# Patient Record
Sex: Male | Born: 1945 | Race: White | Hispanic: No | Marital: Married
Health system: Midwestern US, Academic
[De-identification: ages and names within clinical notes are randomized; demographics above are authoritative.]

---

## 2017-04-11 MED ORDER — TIZANIDINE 2 MG PO TAB
2 mg | ORAL_TABLET | Freq: Two times a day (BID) | ORAL | 0 refills | Status: AC | PRN
Start: 2017-04-11 — End: ?

## 2017-04-12 MED ORDER — BUPIVACAINE (PF) 0.5 % (5 MG/ML) IJ SOLN
10 mL | Freq: Once | INTRAMUSCULAR | 0 refills | Status: CP | PRN
Start: 2017-04-12 — End: ?

## 2017-04-12 MED ORDER — METHYLPREDNISOLONE ACETATE 40 MG/ML IJ SUSP
40 mg | Freq: Once | INTRA_ARTICULAR | 0 refills | Status: CP | PRN
Start: 2017-04-12 — End: ?

## 2017-05-09 ENCOUNTER — Ambulatory Visit: Admit: 2017-05-09 | Discharge: 2017-05-09 | Payer: MEDICARE

## 2017-05-09 ENCOUNTER — Encounter: Admit: 2017-05-09 | Discharge: 2017-05-09 | Payer: MEDICARE

## 2017-05-09 DIAGNOSIS — M961 Postlaminectomy syndrome, not elsewhere classified: Secondary | ICD-10-CM

## 2017-05-09 DIAGNOSIS — I10 Essential (primary) hypertension: Principal | ICD-10-CM

## 2017-05-09 DIAGNOSIS — G8929 Other chronic pain: ICD-10-CM

## 2017-05-09 DIAGNOSIS — M5416 Radiculopathy, lumbar region: ICD-10-CM

## 2017-05-16 ENCOUNTER — Encounter: Admit: 2017-05-16 | Discharge: 2017-05-16 | Payer: MEDICARE

## 2017-05-16 DIAGNOSIS — I10 Essential (primary) hypertension: Principal | ICD-10-CM

## 2017-05-25 ENCOUNTER — Encounter: Admit: 2017-05-25 | Discharge: 2017-05-25 | Payer: MEDICARE

## 2017-05-25 DIAGNOSIS — M5416 Radiculopathy, lumbar region: Principal | ICD-10-CM

## 2017-05-25 DIAGNOSIS — M961 Postlaminectomy syndrome, not elsewhere classified: ICD-10-CM

## 2017-05-25 DIAGNOSIS — G8929 Other chronic pain: ICD-10-CM

## 2017-05-30 ENCOUNTER — Ambulatory Visit: Admit: 2017-05-30 | Discharge: 2017-05-31 | Payer: MEDICARE

## 2017-05-30 ENCOUNTER — Encounter: Admit: 2017-05-30 | Discharge: 2017-05-30 | Payer: MEDICARE

## 2017-05-30 DIAGNOSIS — I10 Essential (primary) hypertension: Principal | ICD-10-CM

## 2017-05-30 NOTE — Progress Notes
[facility] CLINIC NOTE  Subjective     SUBJECTIVE:   Follow up for lower back pain  Chronic lower back and hip pain  Pain is sharp, stabbing, and severe  VAS 7/10  Has had intrathecal trials x 3: all with good pain relief, inquiring about ITP implant         Review of Systems   Constitutional: Positive for fatigue.   HENT: Positive for postnasal drip, rhinorrhea and tinnitus.    Eyes: Positive for discharge, redness and itching.   Respiratory: Positive for apnea and shortness of breath.    Gastrointestinal: Positive for abdominal pain.   Endocrine: Positive for heat intolerance.   Genitourinary: Positive for frequency.   Musculoskeletal: Positive for arthralgias, back pain, gait problem, myalgias, neck pain and neck stiffness.   Neurological: Positive for light-headedness and headaches.   Hematological: Bruises/bleeds easily.   Psychiatric/Behavioral: Positive for sleep disturbance.   All other systems reviewed and are negative.      Current Outpatient Prescriptions:   ???  amLODIPine (NORVASC) 10 mg tablet, 10 mg., Disp: , Rfl:   ???  BUPROPION HCL (WELLBUTRIN PO), Take 150 mg by mouth., Disp: , Rfl:   ???  cloNIDine (CATAPRES) 0.1 mg tablet, Take 1 tablet by mouth twice daily., Disp: 7 tablet, Rfl: 3  ???  diphenhydrAMINE (BENADRYL) 25 mg capsule, Take  by mouth., Disp: , Rfl:   ???  duloxetine DR (CYMBALTA) 30 mg capsule, , Disp: , Rfl:   ???  FOLIC ACID/MULTIVIT-MIN/LUTEIN (CENTRUM SILVER PO), Take  by mouth., Disp: , Rfl:   ???  lisinopril (PRINIVIL, ZESTRIL) 40 mg tablet, , Disp: , Rfl:   ???  metoprolol tartrate (LOPRESSOR) 100 mg tablet, , Disp: , Rfl:   ???  oxyCODONE/acetaminophen (PERCOCET; ENDOCET) 10/325 mg tablet, , Disp: , Rfl:   ???  SENNOSIDES (SENNA LAX PO), Take 8.6 mg by mouth., Disp: , Rfl:   No Known Allergies  Physical Exam  Vitals:    05/30/17 1044   BP: (!) 142/104   Pulse: 53   SpO2: 100%   Weight: 87.1 kg (192 lb)   Height: 177.8 cm (70)        Pain Score: Eight  Body mass index is 27.55 kg/m???. Gen: Alert x 3  Chest: CTAB  Neck:Supple  Psych: Normal mood and affect  Skin: no rashes or lesions  Neuro: Grossly intact  Musc:     Tenderness in L-spine   strnegth intact in BLE's         IMPRESSION:  1. Chronic intractable pain     2. Lumbar radiculopathy     3. Cervical radiculopathy           PLAN:    Discussed proceeding with intrathecal pain pump   Will trial Prialt given his side effects with other medications trialed

## 2017-05-31 DIAGNOSIS — M5416 Radiculopathy, lumbar region: ICD-10-CM

## 2017-05-31 DIAGNOSIS — M5412 Radiculopathy, cervical region: Secondary | ICD-10-CM

## 2017-05-31 DIAGNOSIS — G8929 Other chronic pain: Principal | ICD-10-CM

## 2017-06-21 ENCOUNTER — Encounter: Admit: 2017-06-21 | Discharge: 2017-06-21 | Payer: MEDICARE

## 2017-06-21 NOTE — Telephone Encounter
Patient has questions about his pump trial on 07/08/17. He has so much pain from the SCS pocket that he wants it moved.  RN answered questions and patient will discuss with Dr. Otho Ket at the trial.

## 2017-07-08 ENCOUNTER — Ambulatory Visit: Admit: 2017-07-08 | Discharge: 2017-07-09 | Payer: MEDICARE

## 2017-07-08 ENCOUNTER — Encounter: Admit: 2017-07-08 | Discharge: 2017-07-08 | Payer: MEDICARE

## 2017-07-08 DIAGNOSIS — G893 Neoplasm related pain (acute) (chronic): Principal | ICD-10-CM

## 2017-07-08 DIAGNOSIS — I10 Essential (primary) hypertension: Principal | ICD-10-CM

## 2017-07-08 MED ORDER — ZICONOTIDE IT TRIAL SYRINGE (LOW DOSE)
2 ug | Freq: Once | INTRATHECAL | 0 refills | Status: CP
Start: 2017-07-08 — End: ?
  Administered 2017-07-08 (×2): 2 ug via INTRATHECAL

## 2017-07-08 MED ORDER — IOPAMIDOL 41 % IT SOLN
2.5 mL | Freq: Once | EPIDURAL | 0 refills | Status: CP
Start: 2017-07-08 — End: ?
  Administered 2017-07-08: 15:00:00 2.5 mL via EPIDURAL

## 2017-07-08 NOTE — Procedures
Attending Surgeon: [name], MD    Anesthesia: Local    Pre-Procedure Diagnosis:   1. Chronic intractable pain    2. Lumbar radiculopathy        Post-Procedure Diagnosis:   1. Chronic intractable pain    2. Lumbar radiculopathy        Intrathecal Pump Trial  Procedure: epidural - interlaminar    Laterality: n/a  Location: lumbar - L5-S1      Consent:   Consent obtained: verbal and written  Consent given by: patient  Risks discussed: allergic reaction, bleeding, bruising, infection, nerve damage, no change or worsening in pain, weakness, swelling and reaction to medication  Alternatives discussed: alternative treatment and delayed treatment  Discussed with patient the purpose of the treatment/procedure, other ways of treating my condition, including no treatment/ procedure and the risks and benefits of the alternatives. Patient has decided to proceed with treatment/procedure.        Universal Protocol:  Relevant documents: relevant documents present and verified  Test results: test results available and properly labeled  Imaging studies: imaging studies available  Required items: required blood products, implants, devices, and special equipment available  Site marked: the operative site was marked  Patient identity confirmed: Patient identify confirmed verbally with patient.      Time out: Immediately prior to procedure a time out was called to verify the correct patient, procedure, equipment, support staff and site/side marked as required      Procedures Details:   Indications: pain   Prep: Betadine  Patient position: prone  Estimated Blood Loss: minimal  Specimens: none  Amount Injected:   L5-S1: 1mL    Number of Levels: 1  Approach: midline  Guidance: fluoroscopy  Contrast: Procedure confirmed with contrast under live fluoroscopy.  Needle and Epidural Catheter: pencil-tip  Needle size: 25 G  Injection procedure: Incremental injection and Negative aspiration for blood Patient tolerance: Patient tolerated the procedure well with no immediate complications. Pressure was applied, and hemostasis was accomplished.  Outcome: Pain unchanged  Comments: of ziconotide injected. Pain relief of 80% without any side effects noted      Estimated blood loss: none or minimal  Specimens: none  Patient tolerated the procedure well with no immediate complications. Pressure was applied, and hemostasis was accomplished.

## 2017-07-08 NOTE — Progress Notes
1050: patient tolerated walking around the unit twice following administration of his medication. He reports that his pain is a 4, but that he feels fine. Dr. Otho Ket and Cheri by to speak with the patient-sch'd for surgery on the 27th. AVS reviewed at the bedside. No questions or concerns prior to releasing patient.

## 2017-07-08 NOTE — Progress Notes
[facility]  INTERVENTIONAL PAIN PROCEDURE HISTORY AND PHYSICAL    Chief Complaint   Patient presents with    Lower Back - Pain       HISTORY OF PRESENT ILLNESS:   Follow up for lower back pain  Chronic lower back and hip pain  Pain is sharp, stabbing, and severe  VAS 7/10  Has had intrathecal trials x 3: all with good pain relief, inquiring about ITP implant    Past Medical History:   Diagnosis Date    Essential hypertension        Past Surgical History:   Procedure Laterality Date    BACK SURGERY      [place]         family history is not on file.    Social History     Social History    Marital status: Married     Spouse name: N/A    Number of children: N/A    Years of education: N/A     Occupational History    Not on file.     Social History Main Topics    Smoking status: Never Smoker    Smokeless tobacco: Never Used    Alcohol use Not on file    Drug use: Unknown    Sexual activity: Not on file     Other Topics Concern    Not on file     Social History Narrative    No narrative on file       No Known Allergies    Vitals:    07/08/17 0855   BP: 149/74   Pulse: 50   Resp: 18   Temp: 36.6 C (97.8 F)   TempSrc: Oral   SpO2: 99%   Weight: 88.5 kg (195 lb)   Height: 177.8 cm (70")       REVIEW OF SYSTEMS: 10 point ROS obtained and negative except per HPI      PHYSICAL EXAM:  Gen: Alert x 3  Chest: CTAB  Neck:Supple  Psych: Normal mood and affect  Skin: no rashes or lesions  Neuro: Grossly intact  Musc:     Tenderness in L-spine        IMPRESSION:    1. Chronic intractable pain    2. Lumbar radiculopathy         PLAN:   Intrathecal trial with prialt

## 2017-07-09 ENCOUNTER — Ambulatory Visit: Admit: 2017-07-08 | Discharge: 2017-07-09 | Payer: MEDICARE

## 2017-07-09 DIAGNOSIS — I10 Essential (primary) hypertension: ICD-10-CM

## 2017-07-09 DIAGNOSIS — M5416 Radiculopathy, lumbar region: ICD-10-CM

## 2017-07-09 DIAGNOSIS — G8929 Other chronic pain: Principal | ICD-10-CM

## 2017-07-11 ENCOUNTER — Encounter: Admit: 2017-07-11 | Discharge: 2017-07-11 | Payer: MEDICARE

## 2017-07-11 DIAGNOSIS — M5416 Radiculopathy, lumbar region: Principal | ICD-10-CM

## 2017-07-22 ENCOUNTER — Encounter: Admit: 2017-07-22 | Discharge: 2017-07-22 | Payer: MEDICARE

## 2017-07-22 NOTE — Telephone Encounter
RN called patient to confirm his surgery on Monday, August 27th. NPO for 8 hours, Hibiclens soap at bedtime, no blood thinners. Patient understands and agrees to plan.

## 2017-07-24 ENCOUNTER — Encounter: Admit: 2017-07-24 | Discharge: 2017-07-24 | Payer: MEDICARE

## 2017-07-24 DIAGNOSIS — I10 Essential (primary) hypertension: Principal | ICD-10-CM

## 2017-07-25 ENCOUNTER — Encounter: Admit: 2017-07-25 | Discharge: 2017-07-25 | Payer: MEDICARE

## 2017-07-25 ENCOUNTER — Ambulatory Visit: Admit: 2017-07-23 | Discharge: 2017-07-23 | Payer: MEDICARE

## 2017-07-25 ENCOUNTER — Ambulatory Visit: Admit: 2017-07-25 | Discharge: 2017-07-25 | Payer: MEDICARE

## 2017-07-25 DIAGNOSIS — M47812 Spondylosis without myelopathy or radiculopathy, cervical region: ICD-10-CM

## 2017-07-25 DIAGNOSIS — T85193A Other mechanical complication of implanted electronic neurostimulator, generator, initial encounter: Principal | ICD-10-CM

## 2017-07-25 DIAGNOSIS — I10 Essential (primary) hypertension: ICD-10-CM

## 2017-07-25 MED ORDER — LACTATED RINGERS IV SOLP
INTRAVENOUS | 0 refills | Status: DC
Start: 2017-07-25 — End: 2017-07-26
  Administered 2017-07-25 (×2): 1000.000 mL via INTRAVENOUS

## 2017-07-25 MED ORDER — PROPOFOL INJ 10 MG/ML IV VIAL
0 refills | Status: DC
Start: 2017-07-25 — End: 2017-07-25
  Administered 2017-07-25: 20:00:00 40 mg via INTRAVENOUS
  Administered 2017-07-25: 19:00:00 50 mg via INTRAVENOUS
  Administered 2017-07-25: 20:00:00 20 mg via INTRAVENOUS
  Administered 2017-07-25: 20:00:00 30 mg via INTRAVENOUS

## 2017-07-25 MED ORDER — PROMETHAZINE 25 MG/ML IJ SOLN
6.25 mg | INTRAVENOUS | 0 refills | Status: DC | PRN
Start: 2017-07-25 — End: 2017-07-26

## 2017-07-25 MED ORDER — HYDROMORPHONE (PF) 2 MG/ML IJ SYRG
.5-1 mg | INTRAVENOUS | 0 refills | Status: DC | PRN
Start: 2017-07-25 — End: 2017-07-26
  Administered 2017-07-25 (×3): 0.5 mg via INTRAVENOUS
  Administered 2017-07-25: 21:00:00 1 mg via INTRAVENOUS

## 2017-07-25 MED ORDER — LIDOCAINE (PF) 10 MG/ML (1 %) IJ SOLN
.1-2 mL | INTRAMUSCULAR | 0 refills | Status: DC | PRN
Start: 2017-07-25 — End: 2017-07-26

## 2017-07-25 MED ORDER — CEPHALEXIN 500 MG PO CAP
500 mg | ORAL_CAPSULE | Freq: Four times a day (QID) | ORAL | 0 refills | Status: AC
Start: 2017-07-25 — End: ?

## 2017-07-25 MED ORDER — OXYCODONE 5 MG PO TAB
5-10 mg | Freq: Once | ORAL | 0 refills | Status: CP | PRN
Start: 2017-07-25 — End: ?
  Administered 2017-07-25: 21:00:00 10 mg via ORAL

## 2017-07-25 MED ORDER — LIDOCAINE-EPINEPHRINE 1 %-1:100,000 IJ SOLN
0 refills | Status: DC
Start: 2017-07-25 — End: 2017-07-26
  Administered 2017-07-25: 20:00:00 18 mL via INTRAMUSCULAR

## 2017-07-25 MED ORDER — DIPHENHYDRAMINE HCL 50 MG/ML IJ SOLN
25 mg | Freq: Once | INTRAVENOUS | 0 refills | Status: DC | PRN
Start: 2017-07-25 — End: 2017-07-26

## 2017-07-25 MED ORDER — FENTANYL CITRATE (PF) 50 MCG/ML IJ SOLN
0 refills | Status: DC
Start: 2017-07-25 — End: 2017-07-25
  Administered 2017-07-25 (×2): 25 ug via INTRAVENOUS

## 2017-07-25 MED ORDER — MIDAZOLAM 1 MG/ML IJ SOLN
INTRAVENOUS | 0 refills | Status: DC
Start: 2017-07-25 — End: 2017-07-25
  Administered 2017-07-25 (×2): 1 mg via INTRAVENOUS

## 2017-07-25 MED ORDER — HYDROCODONE-ACETAMINOPHEN 5-325 MG PO TAB
1-2 | ORAL_TABLET | ORAL | 0 refills | 15.00000 days | Status: AC | PRN
Start: 2017-07-25 — End: 2017-08-15

## 2017-07-25 MED ORDER — DEXMEDETOMIDINE# 4MCG/ML IV SOLN
0 refills | Status: DC
Start: 2017-07-25 — End: 2017-07-25
  Administered 2017-07-25 (×5): 8 ug via INTRAVENOUS

## 2017-07-25 MED ORDER — PROPOFOL 10 MG/ML IV EMUL 50 ML (INFUSION)(AM)(OR)
INTRAVENOUS | 0 refills | Status: DC
Start: 2017-07-25 — End: 2017-07-25
  Administered 2017-07-25: 19:00:00 100 ug/kg/min via INTRAVENOUS

## 2017-07-25 MED ORDER — FENTANYL CITRATE (PF) 50 MCG/ML IJ SOLN
25-50 ug | INTRAVENOUS | 0 refills | Status: DC | PRN
Start: 2017-07-25 — End: 2017-07-26

## 2017-07-25 MED ORDER — HALOPERIDOL LACTATE 5 MG/ML IJ SOLN
1 mg | Freq: Once | INTRAVENOUS | 0 refills | Status: CP | PRN
Start: 2017-07-25 — End: ?
  Administered 2017-07-25: 21:00:00 1 mg via INTRAVENOUS

## 2017-07-25 MED ORDER — LACTATED RINGERS IV SOLP
INTRAVENOUS | 0 refills | Status: DC
Start: 2017-07-25 — End: 2017-07-26

## 2017-07-25 MED ORDER — CEFAZOLIN 1 GRAM IJ SOLR
0 refills | Status: DC
Start: 2017-07-25 — End: 2017-07-25
  Administered 2017-07-25: 20:00:00 2 g via INTRAVENOUS

## 2017-07-25 NOTE — Anesthesia Post-Procedure Evaluation
Post-Anesthesia Evaluation    Name: Kristopher Richard      MRN: [id number]     DOB: 1946-07-01     Age: 71 y.o.     Sex: male   __________________________________________________________________________     Procedure Date: 07/25/2017  Procedure: Procedure(s) with comments:  REVISION  SPINAL NEUROSTIMULATOR PULSE GENERATOR/ RECEIVER - CASE LENGTH 1 HOUR, REQUEST 1300 START IN 2ND ROOM, NEED C-ARM, NEVRO (REP AWARE)      Surgeon: Surgeon(s):  Clotilde Dieter, MD  Marilynn Rail., DO    Post-Anesthesia Vitals  BP: 112/55 (08/27 1645)  Pulse: 54 (08/27 1645)  Respirations: 12 PER MINUTE (08/27 1645)  SpO2: 97 % (08/27 1645)  O2 Delivery: None (Room Air) (08/27 1645)  SpO2 Pulse: 51 (08/27 1645)      Post Anesthesia Evaluation Note    Evaluation location: Pre/Post  Patient participation: recovered; patient participated in evaluation  Level of consciousness: alert    Pain score: 6 (Chronic Pain and pt states pain is tolerable)  Pain management: adequate    Hydration: normovolemia  Temperature: 36.0C - 38.4C  Airway patency: adequate    Perioperative Events  Perioperative events:  no       Post-op nausea and vomiting: no PONV    Postoperative Status  Cardiovascular status: hemodynamically stable  Respiratory status: spontaneous ventilation  Follow-up needed: none        Perioperative Events  Perioperative Event: No  Emergency Case Activation: No

## 2017-07-25 NOTE — Other
Procedure Note    Kristopher Richard is a 71 y.o. male.      Procedures  Immediate Post Procedure Note    Date:  07/25/2017    Kristopher Richard is a 71 y.o. y.o. male.     DOB: Mar 07, 1946                  MRN#:  [id number]                                   Attending Physician:   No name on file.  Performing Provider:  Jacqulynn Cadet. Royce Macadamia, DO    Procedure(s): Spinal Cord Stimulator Revision    Consent:  Consent obtained from patient.  Time out performed: Consent obtained, correct patient verified, correct procedure verified, correct site verified, patient marked as necessary.  Indications: IPG pocket pain    Anesthesia: MAC (Monitored Anesthesia Care)    Findings:  Intact IPG, pocket revision to left flank    Estimated Blood Loss:  None/Negligible  Specimen(s) Removed/Disposition:  None  Complications: None  Patient Tolerated Procedure: Well  Post-Procedure Condition:  stable                  Jacqulynn Cadet. Royce Macadamia, DO

## 2017-07-25 NOTE — Anesthesia Pre-Procedure Evaluation
Anesthesia Pre-Procedure Evaluation    Name: Kristopher Richard      MRN: [id number]     DOB: 21-Oct-1946     Age: 71 y.o.     Sex: male   __________________________________________________________________________     Procedure Date: 07/25/2017   Procedure: Procedure(s) with comments:  REMOVAL/ REVISION  SPINAL NEUROSTIMULATOR PULSE GENERATOR/ RECEIVER - CASE LENGTH 1 HOUR, REQUEST 1300 START IN 2ND ROOM, NEED C-ARM, NEVRO (REP AWARE)     Physical Assessment  Vital Signs (last filed in past 24 hours):  BP: 161/73 (08/27 1258)  Temp: 36.7 ???C (98.1 ???F) (08/27 1258)  Pulse: 52 (08/27 1258)  Respirations: 16 PER MINUTE (08/27 1258)  SpO2: 98 % (08/27 1258)  O2 Delivery: None (Room Air) (08/27 1258)  Height: 177.8 cm (70) (08/27 1258)  Weight: 90.7 kg (199 lb 15.3 oz) (08/27 1258)      Patient History  No Known Allergies     Current Medications    Medication Directions   amLODIPine (NORVASC) 10 mg tablet 10 mg.   BUPROPION HCL (WELLBUTRIN PO) Take 150 mg by mouth.   cloNIDine (CATAPRES) 0.1 mg tablet Take 1 tablet by mouth twice daily.   diphenhydrAMINE (BENADRYL) 25 mg capsule Take  by mouth.   duloxetine DR (CYMBALTA) 30 mg capsule    FOLIC ACID/MULTIVIT-MIN/LUTEIN (CENTRUM SILVER PO) Take  by mouth.   lisinopril (PRINIVIL, ZESTRIL) 40 mg tablet    metoprolol tartrate (LOPRESSOR) 100 mg tablet    oxyCODONE/acetaminophen (PERCOCET; ENDOCET) 10/325 mg tablet    SENNOSIDES (SENNA LAX PO) Take 8.6 mg by mouth.         Review of Systems/Medical History            Airway - negative        Pulmonary - negative          Cardiovascular         Exercise tolerance: >4 METS (limited by pain. no CP or SOA)        Hypertension, well controlled      No past MI,       No hx of coronary artery disease      No dysrhythmias      No angina      GI/Hepatic/Renal - negative        Neuro/Psych         Neuromuscular disease      Neuropathy      Lumbar radiculopathy s/p intrathecal pump placement        Musculoskeletal         Back pain      Arthritis Physical Exam    Airway Findings      Mallampati: II      TM distance: >3 FB      Neck ROM: full      Mouth opening: good      Airway patency: adequate      Comments: Limited neck flexion 2/2 fusion. Good ROM on extension      Dental Findings: Negative      Cardiovascular Findings:       Rhythm: regular      Rate: normal    Pulmonary Findings: Negative      Abdominal Findings: Negative         Diagnostic Tests  Hematology: No results found for: HGB, HCT, PLTCT, WBC, NEUT, ANC, LYMPH, ALC, ABSLYMPHCT, MONA, AMC, EOSA, ABC, BASOPHILS, MCV, MCH, MCHC, MPV, RDW      General Chemistry: No results found  for: NA, K, CL, CO2, GAP, BUN, CR, GLU, CA, KETONES, ALBUMIN, LACTIC, OBSCA, MG, TOTBILI, TOTBILCB, PO4   Coagulation: No results found for: PT, PTT, INR      Anesthesia Plan    ASA score: 2   Plan: MAC  Induction method: intravenous  NPO status: acceptable      Informed Consent  Anesthetic plan and risks discussed with patient.        Plan discussed with: anesthesiologist and CRNA.

## 2017-07-25 NOTE — H&P (View-Only)
Admission History and Physical Examination      Name:  Kristopher Richard                                             MRN:  [id number]   Admission Date:  07/25/2017                     Assessment/Plan:    Principal Problem:    Lumbar radiculopathy    Revision of SCS battery  __________________________________________________________________________________  Primary Care Physician: Kristopher Richard  Verified    Chief Complaint:  Battery pain  History of Present Illness: Kristopher Richard is a 71 y.o. male with severe battery site pain. Has failed conservative measures    Past Medical History:   Diagnosis Date   ??? Essential hypertension      Past Surgical History:   Procedure Laterality Date   ??? BACK SURGERY     ??? HERNIA REPAIR     ??? KNEE SURGERY     ??? NECK SURGERY     ??? PENIS SURGERY     ??? PROSTATECTOMY       Family history reviewed; non-contributory  Social History     Social History   ??? Marital status: Married     Spouse name: N/A   ??? Number of children: N/A   ??? Years of education: N/A     Social History Main Topics   ??? Smoking status: Never Smoker   ??? Smokeless tobacco: Never Used   ??? Alcohol use Not on file   ??? Drug use: Unknown   ??? Sexual activity: Not on file     Other Topics Concern   ??? Not on file     Social History Narrative   ??? No narrative on file      Immunizations (includes history and patient reported):   There is no immunization history on file for this patient.        Allergies:  Patient has no known allergies.    Medications:  Prescriptions Prior to Admission   Medication Sig   ??? amLODIPine (NORVASC) 10 mg tablet 10 mg.   ??? BUPROPION HCL (WELLBUTRIN PO) Take 150 mg by mouth.   ??? cloNIDine (CATAPRES) 0.1 mg tablet Take 1 tablet by mouth twice daily.   ??? diphenhydrAMINE (BENADRYL) 25 mg capsule Take  by mouth.   ??? duloxetine DR (CYMBALTA) 30 mg capsule    ??? FOLIC ACID/MULTIVIT-MIN/LUTEIN (CENTRUM SILVER PO) Take  by mouth.   ??? lisinopril (PRINIVIL, ZESTRIL) 40 mg tablet ??? metoprolol tartrate (LOPRESSOR) 100 mg tablet    ??? oxyCODONE/acetaminophen (PERCOCET; ENDOCET) 10/325 mg tablet    ??? SENNOSIDES (SENNA LAX PO) Take 8.6 mg by mouth.     Review of Systems:  All other systems reviewed and are negative.    Physical Exam:  Vital Signs: Last Filed In 24 Hours Vital Signs: 24 Hour Range   BP: 161/73 (08/27 1258)  Temp: 36.7 ???C (98.1 ???F) (08/27 1258)  Pulse: 52 (08/27 1258)  Respirations: 16 PER MINUTE (08/27 1258)  SpO2: 98 % (08/27 1258)  O2 Delivery: None (Room Air) (08/27 1258)  Height: 177.8 cm (70) (08/27 1258) BP: (161)/(73)   Temp:  [36.7 ???C (98.1 ???F)]   Pulse:  [52]   Respirations:  [16 PER MINUTE]   SpO2:  [98 %]  O2 Delivery: None (Room Air)   Intensity Pain Scale 0-10 (Pain 1): 8 (07/25/17 1308)      General:  Alert, cooperative, no distress, appears stated age  Ears:  Normal TMs and external ear canals, both ears  Throat:  Lips, mucosa and tongue normal.  Teeth and gums normal  Lungs:  Clear to auscultation bilaterally  Chest wall:  No tenderness or deformity.  Abdomen:  Soft, non-tender.  Bowel sounds normal.  No masses.  No organomegaly.    Lab/Radiology/Other Diagnostic Tests:  Pertinent labs reviewed     Pertinent radiology reviewed.    Kristopher Kennedy, MD  Pager [phone]

## 2017-07-27 ENCOUNTER — Encounter: Admit: 2017-07-27 | Discharge: 2017-07-27 | Payer: MEDICARE

## 2017-07-27 DIAGNOSIS — I10 Essential (primary) hypertension: Principal | ICD-10-CM

## 2017-08-04 ENCOUNTER — Encounter: Admit: 2017-08-04 | Discharge: 2017-08-04 | Payer: MEDICARE

## 2017-08-04 ENCOUNTER — Ambulatory Visit: Admit: 2017-08-04 | Discharge: 2017-08-05 | Payer: MEDICARE

## 2017-08-04 DIAGNOSIS — I10 Essential (primary) hypertension: Principal | ICD-10-CM

## 2017-08-04 DIAGNOSIS — M961 Postlaminectomy syndrome, not elsewhere classified: ICD-10-CM

## 2017-08-04 NOTE — Progress Notes
[facility] CLINIC NOTE  Subjective     SUBJECTIVE: Kristopher Richard presents for follow-up of spinal cord stimulator generator revision.  He says the new position of the generator is much more tolerable.  He does have some soreness in the area.  He denies fevers, chills or excessive drainage from either incision.  He has been adherent to the post procedure restrictions including lifting and bathing.  He inquired about an intrathecal pump placement.  He reports multiple intrathecal trials with good results and minimal side effects from Prialt.  He is interestingly in proceeding with placement of an intrathecal pump.         Review of Systems   Constitutional: Positive for diaphoresis and fatigue.   HENT: Positive for rhinorrhea and tinnitus.    Eyes: Positive for redness and itching.   Respiratory: Positive for apnea.    Gastrointestinal: Positive for abdominal pain and constipation.   Genitourinary: Positive for difficulty urinating and frequency.   Musculoskeletal: Positive for arthralgias, back pain, joint swelling, myalgias, neck pain and neck stiffness.   Neurological: Positive for light-headedness and headaches.   Hematological: Bruises/bleeds easily.   Psychiatric/Behavioral: Positive for confusion, decreased concentration and sleep disturbance.   All other systems reviewed and are negative.       Current Outpatient Prescriptions:   ???  amLODIPine (NORVASC) 10 mg tablet, 10 mg., Disp: , Rfl:   ???  BUPROPION HCL (WELLBUTRIN PO), Take 150 mg by mouth., Disp: , Rfl:   ???  cloNIDine (CATAPRES) 0.1 mg tablet, Take 1 tablet by mouth twice daily., Disp: 7 tablet, Rfl: 3  ???  diphenhydrAMINE (BENADRYL) 25 mg capsule, Take  by mouth., Disp: , Rfl:   ???  duloxetine DR (CYMBALTA) 30 mg capsule, , Disp: , Rfl:   ???  FOLIC ACID/MULTIVIT-MIN/LUTEIN (CENTRUM SILVER PO), Take  by mouth., Disp: , Rfl:   ???  HYDROcodone/acetaminophen (NORCO) 5/325 mg tablet, Take one tablet to two tablets by mouth every 4 hours as needed for Pain for up to 15 doses, Disp: 15 tablet, Rfl: 0  ???  lisinopril (PRINIVIL, ZESTRIL) 40 mg tablet, , Disp: , Rfl:   ???  metoprolol tartrate (LOPRESSOR) 100 mg tablet, , Disp: , Rfl:   ???  oxyCODONE/acetaminophen (PERCOCET; ENDOCET) 10/325 mg tablet, , Disp: , Rfl:   ???  SENNOSIDES (SENNA LAX PO), Take 8.6 mg by mouth., Disp: , Rfl:   No Known Allergies  Physical Exam  Vitals:    08/04/17 1017   BP: 148/63   Pulse: 51   SpO2: 99%   Weight: 90.7 kg (200 lb)   Height: 177.8 cm (70)        Pain Score: Eight  Body mass index is 28.7 kg/m???.    General: Alert, cooperative, no distress  Head: Normocephalic, atraumatic  Eyes: Conjunctivae/corneas clear  Lungs: Unlabored respirations  Heart: Normal rate by palpation of pulse  Abdomen: Non-distended  Skin: Warm and dry to touch  Psychiatric: Mood and affect normal  Musculoskeletal: Left lower lumbar incision c/d/i without drainage, erythema or ecchymosis; left upper lumbar incision c/d/i with scab overlying upper part of incision, mild ecchymoses over generator, no erythema, drainage or excessive tenderness  Neurologic: Grossly intact         IMPRESSION:  1. Lumbar radiculopathy     2. Chronic intractable pain     3. Cervical radiculopathy     4. Post laminectomy syndrome           PLAN:  We will get him scheduled for intrathecal pump placement.  Absent S/S of infection.  Staples removed. Incisions cleansed with chloro prep and allowed to dry before applying dressing.   Keep incision dry with gauze and tegaderm for a week.   May shower and allow steristrip to get wet thereafter. Pat dry. Do not submerge in water.   Allow steristrips to fall off on their own.   May apply ice pack to surgical site for comfort.   Wear back brace for two weeks post-op.   Gradually increase activity while following activity restrictions.  8 week post-operative activity restrictions; avoiding agressive bending/twisting, reaching overhead, lifting/pulling >10lb.   Activity restrictions will be complete [09/19/17]  RTC in 3 weeks for follow up.       ATTESTATION    I personally performed the key portions of the E/M visit, discussed case with resident and concur with resident documentation of history, physical exam, assessment, and treatment plan unless otherwise noted.    Staff name:  [name], MD Date:  08/04/2017

## 2017-08-05 ENCOUNTER — Encounter: Admit: 2017-08-05 | Discharge: 2017-08-05 | Payer: MEDICARE

## 2017-08-05 DIAGNOSIS — M5416 Radiculopathy, lumbar region: Principal | ICD-10-CM

## 2017-08-05 DIAGNOSIS — M961 Postlaminectomy syndrome, not elsewhere classified: ICD-10-CM

## 2017-08-05 DIAGNOSIS — M5412 Radiculopathy, cervical region: ICD-10-CM

## 2017-08-05 DIAGNOSIS — M545 Low back pain: ICD-10-CM

## 2017-08-15 ENCOUNTER — Encounter: Admit: 2017-08-15 | Discharge: 2017-08-15 | Payer: MEDICARE

## 2017-08-15 ENCOUNTER — Ambulatory Visit: Admit: 2017-08-15 | Discharge: 2017-08-16 | Payer: MEDICARE

## 2017-08-15 DIAGNOSIS — I10 Essential (primary) hypertension: Principal | ICD-10-CM

## 2017-08-15 DIAGNOSIS — Z9689 Presence of other specified functional implants: ICD-10-CM

## 2017-08-15 MED ORDER — CEPHALEXIN 500 MG PO CAP
500 mg | ORAL_CAPSULE | Freq: Four times a day (QID) | ORAL | 0 refills | Status: AC
Start: 2017-08-15 — End: ?

## 2017-08-15 MED ORDER — MUPIROCIN CALCIUM 2 % TP CREA
Freq: Three times a day (TID) | TOPICAL | 0 refills | 11.00000 days | Status: AC
Start: 2017-08-15 — End: ?

## 2017-08-16 DIAGNOSIS — Z9889 Other specified postprocedural states: Principal | ICD-10-CM

## 2017-08-16 DIAGNOSIS — M545 Low back pain: ICD-10-CM

## 2017-08-16 DIAGNOSIS — T814XXA Infection following a procedure, initial encounter: ICD-10-CM

## 2017-08-16 DIAGNOSIS — G8929 Other chronic pain: ICD-10-CM

## 2017-08-16 LAB — CBC AND DIFF

## 2017-08-16 LAB — C REACTIVE PROTEIN (CRP)

## 2017-08-16 LAB — SED RATE

## 2017-08-17 ENCOUNTER — Encounter: Admit: 2017-08-17 | Discharge: 2017-08-17 | Payer: MEDICARE

## 2017-08-17 DIAGNOSIS — T814XXA Infection following a procedure, initial encounter: ICD-10-CM

## 2017-08-17 DIAGNOSIS — M545 Low back pain: ICD-10-CM

## 2017-08-17 DIAGNOSIS — Z9689 Presence of other specified functional implants: Principal | ICD-10-CM

## 2017-08-24 ENCOUNTER — Encounter: Admit: 2017-08-24 | Discharge: 2017-08-24 | Payer: MEDICARE

## 2017-08-24 ENCOUNTER — Ambulatory Visit: Admit: 2017-08-24 | Discharge: 2017-08-25 | Payer: MEDICARE

## 2017-08-24 DIAGNOSIS — I10 Essential (primary) hypertension: Principal | ICD-10-CM

## 2017-08-24 DIAGNOSIS — M545 Low back pain: ICD-10-CM

## 2017-08-25 DIAGNOSIS — Z5189 Encounter for other specified aftercare: Principal | ICD-10-CM

## 2017-08-25 MED ORDER — CEPHALEXIN 500 MG PO CAP
500 mg | ORAL_CAPSULE | Freq: Four times a day (QID) | ORAL | 0 refills | Status: AC
Start: 2017-08-25 — End: 2017-08-31

## 2017-08-31 ENCOUNTER — Encounter: Admit: 2017-08-31 | Discharge: 2017-08-31 | Payer: MEDICARE

## 2017-08-31 ENCOUNTER — Ambulatory Visit: Admit: 2017-08-31 | Discharge: 2017-09-01 | Payer: MEDICARE

## 2017-08-31 DIAGNOSIS — Z9689 Presence of other specified functional implants: ICD-10-CM

## 2017-08-31 DIAGNOSIS — Z5189 Encounter for other specified aftercare: Principal | ICD-10-CM

## 2017-08-31 DIAGNOSIS — I10 Essential (primary) hypertension: Principal | ICD-10-CM

## 2017-08-31 MED ORDER — CEPHALEXIN 500 MG PO CAP
500 mg | ORAL_CAPSULE | Freq: Four times a day (QID) | ORAL | 0 refills | Status: AC
Start: 2017-08-31 — End: ?

## 2017-08-31 NOTE — Progress Notes
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 Ex forgets what he is doing or where he is going. Attributed it to medication.   Admits fatigue. 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        Review of Systems   Constitutional: Positive for diaphoresis, fatigue and unexpected weight change.   HENT: Positive for congestion, mouth sores, postnasal drip and tinnitus.    Eyes: Positive for redness and itching.   Respiratory: Positive for apnea, cough and shortness of breath.    Gastrointestinal: Positive for abdominal pain and constipation.   Endocrine: Negative.    Genitourinary: Positive for frequency.   Musculoskeletal: Negative.    Skin: Negative.    Allergic/Immunologic: Negative.    Neurological: Positive for headaches.   Hematological: Negative.    Psychiatric/Behavioral: Positive for confusion, decreased concentration and sleep disturbance.       Current Outpatient Prescriptions:   ???  amLODIPine (NORVASC) 10 mg tablet, 10 mg., Disp: , Rfl:   ???  BUPROPION HCL (WELLBUTRIN PO), Take 150 mg by mouth., Disp: , Rfl:   ???  cephalexin (KEFLEX) 500 mg capsule, Take one capsule by mouth four times daily for 7 days., Disp: 28 capsule, Rfl: 0  ???  cloNIDine (CATAPRES) 0.1 mg tablet, Take 1 tablet by mouth twice daily., Disp: 7 tablet, Rfl: 3  ???  diphenhydrAMINE (BENADRYL) 25 mg capsule, Take  by mouth., Disp: , Rfl:   ???  duloxetine DR (CYMBALTA) 30 mg capsule, , Disp: , Rfl:   ???  FOLIC ACID/MULTIVIT-MIN/LUTEIN (CENTRUM SILVER PO), Take  by mouth., Disp: , Rfl:   ???  lisinopril (PRINIVIL, ZESTRIL) 40 mg tablet, , Disp: , Rfl: ???  metoprolol tartrate (LOPRESSOR) 100 mg tablet, , Disp: , Rfl:   ???  oxyCODONE/acetaminophen (PERCOCET; ENDOCET) 10/325 mg tablet, , Disp: , Rfl:   ???  SENNOSIDES (SENNA LAX PO), Take 8.6 mg by mouth., Disp: , Rfl:   No Known Allergies  Physical Exam  Vitals:    08/31/17 1055   BP: (P) 140/61   Pulse: (P) 50   Resp: (P) 20   SpO2: (P) 98%   Weight: 86.2 kg (190 lb)   Height: 177.8 cm (70)     Oswestry Total Score:: 44  Pain Score: Six  Body mass index is 27.26 kg/m???.    General: Alert, cooperative, no distress  Head: Normocephalic, without obvious abnormality, atraumatic  Eyes: Conjunctivae/corneas clear  Lungs: Respirations regular and unlabored  Heart: Normal rate  Abdomen: Non-distended  Extremities: Normal, atraumatic  Skin: No obvious rashes or lesions  Neurologic: Grossly normal. Normal steady gait without use of assistive devices.  Psychiatric: Mood and affect normal  Back/Spine: No obvious deformity. Scabbed incision with surrounding pinkish coloration.  +1-2 cap refill around incision. Mild swelling. Absent drainage.           IMPRESSION:  1. Encounter for wound re-check    2. S/P insertion of spinal cord stimulator          PLAN:   Refer to PCP for cognitive evaluation   Encouraged daily walks for 30 minutes  for fatigue   Incision site continues to be pink with increased cap refill. Incision has healed and improved since last visit a week ago.   Continue to keep dry and covered.   Continue Keflex 500mg  QID. Additional refill for another 7 days provided. - Continue ABX therapy until wound has healed, as previously discussed with Dr Janyth Contes.   Instructed to start yogurt daily to promote gut flora and avoid c-diff.   Continue to keep SCS off and do not charge until wound completely healed.   RTC in 1w for wound check or sooner if needed

## 2017-09-07 ENCOUNTER — Encounter: Admit: 2017-09-07 | Discharge: 2017-09-07 | Payer: MEDICARE

## 2017-09-07 ENCOUNTER — Ambulatory Visit: Admit: 2017-09-07 | Discharge: 2017-09-08 | Payer: MEDICARE

## 2017-09-07 DIAGNOSIS — I10 Essential (primary) hypertension: Principal | ICD-10-CM

## 2017-09-07 NOTE — Progress Notes
[facility] CLINIC NOTE  Subjective     SUBJECTIVE:  1w FUV for wound check  Some dark specks on dressing when changing   Denies fevers, chills, positional headaches   Continues to apply bactroban to incision and keep covered   Has been wanting to clean it with rubbing alcohol. Inquiring about such.  Has 5 days left of Keflex   Has been eating yogurt as previously recommended   SCS remains off        Review of Systems   Constitutional: Positive for diaphoresis and fatigue.   HENT: Positive for mouth sores, postnasal drip and tinnitus.    Eyes: Positive for redness and itching.   Respiratory: Positive for apnea, cough and shortness of breath.    Gastrointestinal: Positive for abdominal pain and constipation.   Endocrine: Positive for heat intolerance.   Genitourinary: Negative.    Musculoskeletal: Positive for arthralgias, back pain, joint swelling, myalgias, neck pain and neck stiffness.   Skin: Negative.    Allergic/Immunologic: Positive for environmental allergies.   Neurological: Positive for light-headedness.   Hematological: Bruises/bleeds easily.   Psychiatric/Behavioral: Positive for agitation, confusion, decreased concentration and sleep disturbance.       Current Outpatient Prescriptions:   ???  amLODIPine (NORVASC) 10 mg tablet, 10 mg., Disp: , Rfl:   ???  BUPROPION HCL (WELLBUTRIN PO), Take 150 mg by mouth., Disp: , Rfl:   ???  cephalexin (KEFLEX) 500 mg capsule, Take one capsule by mouth four times daily for 7 days., Disp: 28 capsule, Rfl: 0  ???  cloNIDine (CATAPRES) 0.1 mg tablet, Take 1 tablet by mouth twice daily., Disp: 7 tablet, Rfl: 3  ???  diphenhydrAMINE (BENADRYL) 25 mg capsule, Take  by mouth., Disp: , Rfl:   ???  duloxetine DR (CYMBALTA) 30 mg capsule, , Disp: , Rfl:   ???  FOLIC ACID/MULTIVIT-MIN/LUTEIN (CENTRUM SILVER PO), Take  by mouth., Disp: , Rfl:   ???  lisinopril (PRINIVIL, ZESTRIL) 40 mg tablet, , Disp: , Rfl:   ???  metoprolol tartrate (LOPRESSOR) 100 mg tablet, , Disp: , Rfl: ???  oxyCODONE/acetaminophen (PERCOCET; ENDOCET) 10/325 mg tablet, , Disp: , Rfl:   ???  SENNOSIDES (SENNA LAX PO), Take 8.6 mg by mouth., Disp: , Rfl:   No Known Allergies  Physical Exam  Vitals:    09/07/17 1008   BP: 141/62   Pulse: 50   Resp: 20   SpO2: 92%   Weight: 86.2 kg (190 lb)   Height: 177.8 cm (70)     Oswestry Total Score:: 44  Pain Score: Six  Body mass index is 27.26 kg/m???.    General: Alert, cooperative, no distress  Head: Normocephalic, without obvious abnormality, atraumatic  Eyes: Conjunctivae/corneas clear  Lungs: Respirations regular and unlabored  Heart: Normal rate  Abdomen: Non-distended  Extremities: Normal, atraumatic  Skin: No obvious rashes or lesions  Neurologic: Grossly normal. Normal steady gait without use of assistive devices.   Psychiatric: Mood and affect normal  Back/Spine: No obvious deformity. Mild pinkish coloration around surgical incision with IPG palpated. +1-2 cap refill.             IMPRESSION:  1. Encounter for wound re-check    2. S/P insertion of spinal cord stimulator          PLAN:   Surgical incision slowly improving  Improvements noted since last visit   Dressing changed today.   DC Bactroban. Rubbing alcohol not recommended.  Continue to keep dry and covered.  Continue  remaining 5 days of Keflex  Continue yogurt daily to avoid development of cdiff from antibiotic therapy.   Continue to keep SCS off until wound healed  RTC in 7 days for wound check or sooner if needed

## 2017-09-08 ENCOUNTER — Encounter: Admit: 2017-09-08 | Discharge: 2017-09-08 | Payer: MEDICARE

## 2017-09-08 DIAGNOSIS — Z5189 Encounter for other specified aftercare: Principal | ICD-10-CM

## 2017-09-08 DIAGNOSIS — M5416 Radiculopathy, lumbar region: ICD-10-CM

## 2017-09-08 DIAGNOSIS — M5116 Intervertebral disc disorders with radiculopathy, lumbar region: Principal | ICD-10-CM

## 2017-09-14 ENCOUNTER — Encounter: Admit: 2017-09-14 | Discharge: 2017-09-14 | Payer: MEDICARE

## 2017-09-14 DIAGNOSIS — I10 Essential (primary) hypertension: Principal | ICD-10-CM

## 2017-09-14 DIAGNOSIS — Z9689 Presence of other specified functional implants: ICD-10-CM

## 2017-09-15 ENCOUNTER — Ambulatory Visit: Admit: 2017-09-14 | Discharge: 2017-09-15 | Payer: MEDICARE

## 2017-09-15 DIAGNOSIS — Z5189 Encounter for other specified aftercare: Principal | ICD-10-CM

## 2017-10-11 ENCOUNTER — Encounter: Admit: 2017-10-11 | Discharge: 2017-10-11 | Payer: MEDICARE

## 2017-10-11 ENCOUNTER — Ambulatory Visit: Admit: 2017-10-11 | Discharge: 2017-10-12 | Payer: MEDICARE

## 2017-10-11 DIAGNOSIS — G8929 Other chronic pain: Principal | ICD-10-CM

## 2017-10-11 DIAGNOSIS — I10 Essential (primary) hypertension: Principal | ICD-10-CM

## 2017-10-11 NOTE — Telephone Encounter
Patient is scheduled for ITP implant 10/17/17 at [facility].  RN spoke with patient's wife with instructions and she understands and agrees with plan.

## 2017-10-11 NOTE — Progress Notes
[facility] CLINIC NOTE  Subjective     SUBJECTIVE:   Here for SCS pocket site/wound check prior to ITP implant in 6 days (Mon 10/17/17)   Nevro SCS for chronic low back/neck pain   Stim off for period of time while incision was healing  Noted an increase in pain when stim was off   He noticed a significant 25-30% relief when the stim was turned back on   He can move his neck/head a lot more when the stim is on   Despite significant relief experienced stim, the pain still interferes with appetite   Scheduled for ITP implant this coming Monday 11/19          Review of Systems   Constitutional: Positive for diaphoresis and fatigue.   HENT: Positive for mouth sores, postnasal drip and tinnitus.    Eyes: Positive for pain, redness and itching.   Respiratory: Positive for apnea and cough.    Cardiovascular: Negative.    Gastrointestinal: Positive for abdominal pain and constipation.   Endocrine: Positive for heat intolerance.   Genitourinary: Positive for frequency.   Musculoskeletal: Positive for arthralgias, back pain, myalgias, neck pain and neck stiffness.   Skin: Negative.    Allergic/Immunologic: Negative.    Neurological: Positive for headaches.   Hematological: Negative.    Psychiatric/Behavioral: Positive for agitation, confusion, decreased concentration and sleep disturbance.       Current Outpatient Prescriptions:   ???  amLODIPine (NORVASC) 10 mg tablet, 10 mg., Disp: , Rfl:   ???  BUPROPION HCL (WELLBUTRIN PO), Take 150 mg by mouth., Disp: , Rfl:   ???  cloNIDine (CATAPRES) 0.1 mg tablet, Take 1 tablet by mouth twice daily., Disp: 7 tablet, Rfl: 3  ???  diphenhydrAMINE (BENADRYL) 25 mg capsule, Take  by mouth., Disp: , Rfl:   ???  duloxetine DR (CYMBALTA) 30 mg capsule, , Disp: , Rfl:   ???  FOLIC ACID/MULTIVIT-MIN/LUTEIN (CENTRUM SILVER PO), Take  by mouth., Disp: , Rfl:   ???  lisinopril (PRINIVIL, ZESTRIL) 40 mg tablet, , Disp: , Rfl:   ???  metoprolol tartrate (LOPRESSOR) 100 mg tablet, , Disp: , Rfl: ???  oxyCODONE/acetaminophen (PERCOCET; ENDOCET) 10/325 mg tablet, , Disp: , Rfl:   ???  SENNOSIDES (SENNA LAX PO), Take 8.6 mg by mouth., Disp: , Rfl:   No Known Allergies  Physical Exam  Vitals:    10/11/17 1410   BP: (!) (P) 112/97   Pulse: (P) 58   Resp: (P) 22   SpO2: (P) 95%   Weight: 88.5 kg (195 lb)   Height: 177.8 cm (70)     Oswestry Total Score:: 44  Pain Score: Six  Body mass index is 27.98 kg/m???.    General: Alert, cooperative, no distress  Head: Normocephalic, without obvious abnormality, atraumatic  Eyes: Conjunctivae/corneas clear  Lungs: Respirations regular and unlabored  Heart: Normal rate  Abdomen: Non-distended  Extremities: Normal, atraumatic  Skin: No obvious rashes or lesions  Neurologic: Grossly normal. Normal steady gait without use of assistive devices.   Psychiatric: Mood and affect normal  Back/Spine: No obvious deformity. Nevro IPG palpated over left low back. Absent drainage, swelling, fluid-filled pockets. Appears as superficial scab removed from medial end of horizontal incision. Scab intact on lateral end of surgical incision.                 IMPRESSION:  1. Encounter for wound re-check    2. Pre-operative clearance    3. Spinal cord stimulator status  PLAN:    Pt met with Nev[employer] rep for SCS setting optimization for neck & low back pain   CBC, CRP & Sed rate today  If labs WNL then okay to proceed with ITP implant Monday 11/19

## 2017-10-12 DIAGNOSIS — Z01818 Encounter for other preprocedural examination: ICD-10-CM

## 2017-10-12 DIAGNOSIS — Z5189 Encounter for other specified aftercare: Principal | ICD-10-CM

## 2017-10-13 LAB — SED RATE

## 2017-10-13 LAB — CBC AND DIFF

## 2017-10-13 LAB — C REACTIVE PROTEIN (CRP)

## 2017-10-14 ENCOUNTER — Encounter: Admit: 2017-10-14 | Discharge: 2017-10-14 | Payer: MEDICARE

## 2017-10-14 DIAGNOSIS — Z01818 Encounter for other preprocedural examination: ICD-10-CM

## 2017-10-14 DIAGNOSIS — Z5189 Encounter for other specified aftercare: Principal | ICD-10-CM

## 2017-10-14 NOTE — Telephone Encounter
Called And Left Message wife Pt Wife to let Zyshawn Know he's blood work came back good and he can have he's Procedure done on 10/17/17 with Dr Otho Ket and if they have any other questions to give [place] a call back .

## 2017-10-17 ENCOUNTER — Encounter: Admit: 2017-10-17 | Discharge: 2017-10-18 | Payer: MEDICARE

## 2017-10-17 ENCOUNTER — Encounter: Admit: 2017-10-17 | Discharge: 2017-10-17 | Payer: MEDICARE

## 2017-10-17 DIAGNOSIS — I10 Essential (primary) hypertension: Principal | ICD-10-CM

## 2017-10-17 MED ORDER — ACETAMINOPHEN 325 MG PO TAB
650 mg | Freq: Once | ORAL | 0 refills | Status: CP
Start: 2017-10-17 — End: ?
  Administered 2017-10-17: 20:00:00 650 mg via ORAL

## 2017-10-17 MED ORDER — DEXTRAN 70-HYPROMELLOSE (PF) 0.1-0.3 % OP DPET
0 refills | Status: DC
Start: 2017-10-17 — End: 2017-10-17
  Administered 2017-10-17: 21:00:00 2 [drp] via OPHTHALMIC

## 2017-10-17 MED ORDER — LIDOCAINE-EPINEPHRINE 1 %-1:100,000 IJ SOLN
0 refills | Status: DC
Start: 2017-10-17 — End: 2017-10-17
  Administered 2017-10-17: 22:00:00 9 mL via INTRAMUSCULAR

## 2017-10-17 MED ORDER — BUPROPION HCL 75 MG PO TAB
150 mg | Freq: Every day | ORAL | 0 refills | Status: DC
Start: 2017-10-17 — End: 2017-10-18

## 2017-10-17 MED ORDER — AMLODIPINE 10 MG PO TAB
10 mg | Freq: Every day | ORAL | 0 refills | Status: DC
Start: 2017-10-17 — End: 2017-10-18
  Administered 2017-10-18: 14:00:00 10 mg via ORAL

## 2017-10-17 MED ORDER — ACETAMINOPHEN 500 MG PO TAB
1000 mg | Freq: Three times a day (TID) | ORAL | 0 refills | Status: DC
Start: 2017-10-17 — End: 2017-10-18
  Administered 2017-10-18 (×2): 1000 mg via ORAL

## 2017-10-17 MED ORDER — SENNOSIDES 8.6 MG PO TAB
1-2 | Freq: Every day | ORAL | 0 refills | Status: DC | PRN
Start: 2017-10-17 — End: 2017-10-18

## 2017-10-17 MED ORDER — OXYCODONE 5 MG PO TAB
5-10 mg | ORAL | 0 refills | Status: DC | PRN
Start: 2017-10-17 — End: 2017-10-18
  Administered 2017-10-18 (×3): 10 mg via ORAL

## 2017-10-17 MED ORDER — FENTANYL CITRATE (PF) 50 MCG/ML IJ SOLN
50 ug | INTRAVENOUS | 0 refills | Status: DC | PRN
Start: 2017-10-17 — End: 2017-10-17

## 2017-10-17 MED ORDER — METOPROLOL TARTRATE 50 MG PO TAB
50 mg | Freq: Two times a day (BID) | ORAL | 0 refills | Status: DC
Start: 2017-10-17 — End: 2017-10-18
  Administered 2017-10-18 (×2): 50 mg via ORAL

## 2017-10-17 MED ORDER — EPHEDRINE SULFATE 50 MG/5ML SYR (10 MG/ML) (AN)(OSM)
0 refills | Status: DC
Start: 2017-10-17 — End: 2017-10-17
  Administered 2017-10-17 (×2): 10 mg via INTRAVENOUS

## 2017-10-17 MED ORDER — ONDANSETRON HCL (PF) 4 MG/2 ML IJ SOLN
INTRAVENOUS | 0 refills | Status: DC
Start: 2017-10-17 — End: 2017-10-17
  Administered 2017-10-17: 22:00:00 4 mg via INTRAVENOUS

## 2017-10-17 MED ORDER — FENTANYL CITRATE (PF) 50 MCG/ML IJ SOLN
25-50 ug | INTRAVENOUS | 0 refills | Status: DC | PRN
Start: 2017-10-17 — End: 2017-10-18

## 2017-10-17 MED ORDER — DIPHENHYDRAMINE HCL 25 MG PO CAP
25 mg | ORAL | 0 refills | Status: DC | PRN
Start: 2017-10-17 — End: 2017-10-18

## 2017-10-17 MED ORDER — FENTANYL CITRATE (PF) 50 MCG/ML IJ SOLN
25 ug | INTRAVENOUS | 0 refills | Status: DC | PRN
Start: 2017-10-17 — End: 2017-10-17

## 2017-10-17 MED ORDER — ORTHO IRRIGATION BOTTLE
0 refills | Status: DC
Start: 2017-10-17 — End: 2017-10-17
  Administered 2017-10-17 (×3): 1000 mL

## 2017-10-17 MED ORDER — ZICONOTIDE 25MCG/ML IT PAIN PUMP SYR
Freq: Once | INTRATHECAL | 0 refills | Status: CP
Start: 2017-10-17 — End: ?
  Administered 2017-10-17: 22:00:00 20.000 mL via INTRATHECAL

## 2017-10-17 MED ORDER — OXYCODONE 5 MG PO TAB
5-10 mg | Freq: Once | ORAL | 0 refills | Status: DC | PRN
Start: 2017-10-17 — End: 2017-10-17

## 2017-10-17 MED ORDER — DEXAMETHASONE SODIUM PHOSPHATE 4 MG/ML IJ SOLN
INTRAVENOUS | 0 refills | Status: DC
Start: 2017-10-17 — End: 2017-10-17
  Administered 2017-10-17: 22:00:00 4 mg via INTRAVENOUS

## 2017-10-17 MED ORDER — OXYCODONE 5 MG PO TAB
10 mg | Freq: Once | ORAL | 0 refills | Status: CP
Start: 2017-10-17 — End: ?
  Administered 2017-10-17: 20:00:00 10 mg via ORAL

## 2017-10-17 MED ORDER — CEFAZOLIN 1 GRAM IJ SOLR
0 refills | Status: DC
Start: 2017-10-17 — End: 2017-10-17
  Administered 2017-10-17: 22:00:00 2 g via INTRAVENOUS

## 2017-10-17 MED ORDER — LIDOCAINE (PF) 10 MG/ML (1 %) IJ SOLN
.1-2 mL | INTRAMUSCULAR | 0 refills | Status: DC | PRN
Start: 2017-10-17 — End: 2017-10-17

## 2017-10-17 MED ORDER — PHENYLEPHRINE IV DRIP (STD CONC)
0 refills | Status: DC
Start: 2017-10-17 — End: 2017-10-17
  Administered 2017-10-17 (×2): 0.5 ug/kg/min via INTRAVENOUS

## 2017-10-17 MED ORDER — LACTATED RINGERS IV SOLP
1000 mL | INTRAVENOUS | 0 refills | Status: DC
Start: 2017-10-17 — End: 2017-10-18
  Administered 2017-10-17: 20:00:00 1000 mL via INTRAVENOUS

## 2017-10-17 MED ORDER — FENTANYL CITRATE (PF) 50 MCG/ML IJ SOLN
0 refills | Status: DC
Start: 2017-10-17 — End: 2017-10-17
  Administered 2017-10-17 (×2): 50 ug via INTRAVENOUS

## 2017-10-17 MED ORDER — BUPROPION XL 150 MG PO TB24
450 mg | Freq: Every day | ORAL | 0 refills | Status: DC
Start: 2017-10-17 — End: 2017-10-18
  Administered 2017-10-18: 14:00:00 450 mg via ORAL

## 2017-10-17 MED ORDER — LIDOCAINE (PF) 200 MG/10 ML (2 %) IJ SYRG
0 refills | Status: DC
Start: 2017-10-17 — End: 2017-10-17
  Administered 2017-10-17: 21:00:00 100 mg via INTRAVENOUS

## 2017-10-17 MED ORDER — DULOXETINE 30 MG PO CPDR
30 mg | Freq: Every day | ORAL | 0 refills | Status: DC
Start: 2017-10-17 — End: 2017-10-18
  Administered 2017-10-18: 14:00:00 30 mg via ORAL

## 2017-10-17 MED ORDER — PROPOFOL INJ 10 MG/ML IV VIAL
0 refills | Status: DC
Start: 2017-10-17 — End: 2017-10-17
  Administered 2017-10-17: 21:00:00 150 mg via INTRAVENOUS

## 2017-10-17 MED ORDER — GLYCOPYRROLATE 0.2 MG/ML IJ SOLN
0 refills | Status: DC
Start: 2017-10-17 — End: 2017-10-17
  Administered 2017-10-17: 22:00:00 0.2 mg via INTRAVENOUS

## 2017-10-17 MED ORDER — SUCCINYLCHOLINE CHLORIDE 20 MG/ML IJ SOLN
INTRAVENOUS | 0 refills | Status: DC
Start: 2017-10-17 — End: 2017-10-17
  Administered 2017-10-17: 21:00:00 100 mg via INTRAVENOUS

## 2017-10-17 NOTE — Other
Brief Operative Note    Name: PRATYUSH AMMON is a 71 y.o. 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

## 2017-10-17 NOTE — H&P (View-Only)
Admission History and Physical Examination      Name:  CALLIN GRISE                                             MRN:  [id number]   Admission Date:  10/17/2017                     Assessment/Plan:    Active Problems:    * No active hospital problems. *    Intrathecal pump placement  __________________________________________________________________________________  Primary Care Physician: Erskine Emery  Verified    Chief Complaint:  Intactable pain  History of Present Illness: Kristopher Richard is a 71 y.o. male  He inquired about an intrathecal pump placement.  He reports multiple intrathecal trials with good results and minimal side effects from Prialt.  He is interestingly in proceeding with placement of an intrathecal pump.    Past Medical History:   Diagnosis Date   ??? Essential hypertension      Past Surgical History:   Procedure Laterality Date   ??? NEUROSTIMULATOR PROCEDURE N/A 07/25/2017    REVISION  SPINAL NEUROSTIMULATOR PULSE GENERATOR/ RECEIVER performed by [name], MD at [facility]   ??? BACK SURGERY     ??? HERNIA REPAIR     ??? KNEE SURGERY     ??? NECK SURGERY     ??? PENIS SURGERY     ??? PROSTATECTOMY       No family history on file.  Social History     Social History   ??? Marital status: Married     Spouse name: N/A   ??? Number of children: N/A   ??? Years of education: N/A     Social History Main Topics   ??? Smoking status: Never Smoker   ??? Smokeless tobacco: Never Used   ??? Alcohol use Not on file   ??? Drug use: Unknown   ??? Sexual activity: Not on file     Other Topics Concern   ??? Not on file     Social History Narrative   ??? No narrative on file      Immunizations (includes history and patient reported):   There is no immunization history on file for this patient.        Allergies:  Patient has no known allergies.    Medications:  Current Facility-Administered Medications   Medication   ??? lactated ringers infusion   ??? LACTATED RINGERS IV SOLP [employer] Override) ??? lidocaine PF 1% (10 mg/mL) injection 0.1-2 mL     Review of Systems:  A 14 point review of systems was negative except for: intractable pain    Physical Exam:  Vital Signs: Last Filed In 24 Hours Vital Signs: 24 Hour Range   BP: 179/90 (11/19 1344)  Temp: 36.5 ???C (97.7 ???F) (11/19 1344)  Pulse: 56 (11/19 1344)  Respirations: 16 PER MINUTE (11/19 1344)  SpO2: 98 % (11/19 1344)  O2 Delivery: None (Room Air) (11/19 1344)  Height: 177.8 cm (70) (11/19 1344) BP: (179)/(90)   Temp:  [36.5 ???C (97.7 ???F)]   Pulse:  [56]   Respirations:  [16 PER MINUTE]   SpO2:  [98 %]   O2 Delivery: None (Room Air)   Intensity Pain Scale (Self Report): 6 (10/17/17 1344)      General: Alert, cooperative, no distress  Head: Normocephalic,  atraumatic  Eyes: Conjunctivae/corneas clear  Lungs: Unlabored respirations  Heart: Normal rate by palpation of pulse  Abdomen: Non-distended  Skin: Warm and dry to touch  Psychiatric: Mood and affect normal  Musculoskeletal: Moves all extremities  Neurological: Grossly intact      Lab/Radiology/Other Diagnostic Tests:  24-hour labs:  No results found for this visit on 10/17/17 (from the past 24 hour(s)).     Pertinent radiology reviewed.    Joelyn Oms, MD  Pager

## 2017-10-17 NOTE — Anesthesia Post-Procedure Evaluation
Post-Anesthesia Evaluation    Name: Kristopher Richard      MRN: [id number]     DOB: November 29, 1946     Age: 71 y.o.     Sex: male   __________________________________________________________________________     Procedure Date: 10/17/2017  Procedure: Procedure(s) with comments:  IMPLANTATION INTRATHECAL PUMP IMPLANT - CASE LENGTH 1 HOUR, REQUEST 1300 START IN 2ND ROOM, NEED C-ARM, FLOWONIX (REP AWARE)      Surgeon: Surgeon(s):  Clotilde Dieter, MD    Post-Anesthesia Vitals  BP: 148/61 (11/19 1645)  Temp: 36.7 C (98.1 F) (11/19 1639)  Pulse: 71 (11/19 1645)  Respirations: 21 PER MINUTE (11/19 1645)  SpO2: 100 % (11/19 1645)  O2 Delivery: None (Room Air) (11/19 1645)  SpO2 Pulse: 73 (11/19 1645)      Post Anesthesia Evaluation Note    Evaluation location: Pre/Post  Patient participation: recovered; patient participated in evaluation  Level of consciousness: alert    Pain score: 3  Pain management: adequate    Hydration: normovolemia  Temperature: 36.0C - 38.4C  Airway patency: adequate    Perioperative Events  Perioperative events:  no       Post-op nausea and vomiting: no PONV    Postoperative Status  Cardiovascular status: hemodynamically stable  Respiratory status: spontaneous ventilation  Follow-up needed: none        Perioperative Events  Perioperative Event: No  Emergency Case Activation: No

## 2017-10-18 DIAGNOSIS — Z79899 Other long term (current) drug therapy: ICD-10-CM

## 2017-10-18 DIAGNOSIS — G8929 Other chronic pain: ICD-10-CM

## 2017-10-18 DIAGNOSIS — M5116 Intervertebral disc disorders with radiculopathy, lumbar region: Principal | ICD-10-CM

## 2017-10-18 DIAGNOSIS — Z9689 Presence of other specified functional implants: ICD-10-CM

## 2017-10-18 DIAGNOSIS — I10 Essential (primary) hypertension: ICD-10-CM

## 2017-10-18 DIAGNOSIS — M961 Postlaminectomy syndrome, not elsewhere classified: ICD-10-CM

## 2017-10-18 MED ORDER — CEPHALEXIN 500 MG PO CAP
500 mg | ORAL_CAPSULE | Freq: Four times a day (QID) | ORAL | 0 refills | Status: AC
Start: 2017-10-18 — End: ?

## 2017-10-18 NOTE — Progress Notes
[facility] discharged on 10/18/2017.   Marland Kitchen  Discharge instructions reviewed with patient.  Valuables returned:   Personal Items / Valuables: Valuables/Belongings sent home with family/friends.  Home medications:    .  Functional assessment at discharge complete: Yes .    Pt to lobby via wheelchair

## 2017-10-18 NOTE — Progress Notes
Chronic Pain Progress Note    Name:  Kristopher Richard   WRUEA'V Date:  10/18/2017  Admission Date: 10/17/2017  LOS: 0 days                     Assessment/Plan:    Active Problems:    Presence of intrathecal pump    Intractable pain s/p intrathecal pump placement  - Patient without complaints other than some incisional soreness  - Sponge baths only until follow up appointment, no submerging incision x 4 weeks  - Kelfex x 7 days sent to patient's pharmacy  - Plan for dressing change prior to discharge  - Return to clinic for staple removal and pump titration    Thursday October 27, 2017 ???9:30 AM CST  Return Patient with [name], MD  [facility]  [address]  [place] [address]  [phone]        Joelyn Oms, MD  Pager [phone] 8a-4p M-F (5050 for urgent issues after hours and on weekends)  ________________________________________________________________________    Subjective  Kristopher Richard is a 71 y.o. male.  Patient reports moderate incisional soreness and some drainage. Denies s/s of infection or new neurologic changes.    Medications  Scheduled Meds:  acetaminophen (TYLENOL) tablet 1,000 mg 1,000 mg Oral TID   amLODIPine (NORVASC) tablet 10 mg 10 mg Oral QDAY   buPROPion XL (WELLBUTRIN XL) tablet 450 mg 450 mg Oral QDAY   duloxetine DR (CYMBALTA) capsule 30 mg 30 mg Oral QDAY   metoprolol tartrate (LOPRESSOR) tablet 50 mg 50 mg Oral BID   Continuous Infusions:  ??? lactated ringers infusion 1,000 mL (10/17/17 1345)     PRN and Respiratory Meds:diphenhydrAMINE Q6H PRN, fentaNYL citrate PF Q1H PRN, oxyCODONE Q4H PRN, senna QDAY PRN      Review of Systems:  A 14 point review of systems was negative except for: incisional pain    Objective:                          Vital Signs: Last Filed                 Vital Signs: 24 Hour Range   BP: 121/52 (11/20 0317)  Temp: 36.8 ???C (98.2 ???F) (11/20 8657) Pulse: 66 (11/19 2337)  Respirations: 17 PER MINUTE (11/20 0317)  SpO2: 98 % (11/20 0317)  O2 Delivery: None (Room Air) (11/20 0317)  SpO2 Pulse: 64 (11/19 1700)  Height: 177.8 cm (70) (11/19 1344) BP: (121-179)/(52-90)   Temp:  [36.5 ???C (97.7 ???F)-36.9 ???C (98.4 ???F)]   Pulse:  [56-71]   Respirations:  [16 PER MINUTE-21 PER MINUTE]   SpO2:  [91 %-100 %]   O2 Delivery: None (Room Air)   Intensity Pain Scale (Self Report): 5 (10/17/17 1754) Vitals:    10/17/17 1344   Weight: 88.9 kg (196 lb)       Intake/Output Summary:  (Last 24 hours)    Intake/Output Summary (Last 24 hours) at 10/18/17 0812  Last data filed at 10/17/17 1754   Gross per 24 hour   Intake             1600 ml   Output              610 ml   Net  990 ml      Stool Occurrence: 0    Physical Exam  General: Alert, cooperative, no distress  Head: Normocephalic, atraumatic  Eyes: Conjunctivae/corneas clear  Lungs: Unlabored respirations  Heart: Normal rate by palpation of pulse  Abdomen: Non-distended  Skin: Warm and dry to touch, dressing with some serosanguinous drainage, incision ttp  Psychiatric: Mood and affect normal  Musculoskeletal: Moves all extremities  Neurological: Grossly intact    Lab Review  24-hour labs:  No results found for this visit on 10/17/17 (from the past 24 hour(s)).    Point of Care Testing  (Last 24 hours)       Radiology and other Diagnostics Review:    No pertinent radiology.    Joelyn Oms, MD   Pager

## 2017-10-19 ENCOUNTER — Encounter: Admit: 2017-10-19 | Discharge: 2017-10-19 | Payer: MEDICARE

## 2017-10-19 DIAGNOSIS — I10 Essential (primary) hypertension: Principal | ICD-10-CM

## 2017-10-27 ENCOUNTER — Encounter: Admit: 2017-10-27 | Discharge: 2017-10-27 | Payer: MEDICARE

## 2017-10-27 ENCOUNTER — Ambulatory Visit: Admit: 2017-10-27 | Discharge: 2017-10-28 | Payer: MEDICARE

## 2017-10-27 DIAGNOSIS — I10 Essential (primary) hypertension: Principal | ICD-10-CM

## 2017-10-27 NOTE — Progress Notes
[facility] CLINIC NOTE  Subjective     SUBJECTIVE:   QASEM RUDOLF presents for follow up of intrathecal pump placement  He does not have any major complaints today  Reports some incisional soreness  Denies s/s of infection  Pain about the same as before pump placement         Review of Systems   Constitutional: Positive for diaphoresis, fatigue and unexpected weight change.   HENT: Positive for postnasal drip, rhinorrhea and tinnitus.    Eyes: Positive for pain, discharge and itching.   Respiratory: Positive for apnea and cough.    Cardiovascular: Negative.    Gastrointestinal: Positive for abdominal pain.   Endocrine: Positive for heat intolerance.   Genitourinary: Negative.    Musculoskeletal: Negative.    Skin: Negative.    Allergic/Immunologic: Negative.    Neurological: Positive for light-headedness and headaches.   Hematological: Negative.    Psychiatric/Behavioral: Negative.        Current Outpatient Prescriptions:   ???  amLODIPine (NORVASC) 10 mg tablet, Take 10 mg by mouth daily., Disp: , Rfl:   ???  buPROPion XL (WELLBUTRIN XL) 300 mg tablet, Take 300 mg by mouth every morning. Take with a 150mg  tablet for total daily dose of 450mg . Do not crush or chew., Disp: , Rfl:   ???  cloNIDine (CATAPRES) 0.1 mg tablet, Take 1 tablet by mouth twice daily., Disp: 7 tablet, Rfl: 3  ???  duloxetine DR (CYMBALTA) 30 mg capsule, Take 30 mg by mouth daily., Disp: , Rfl:   ???  lisinopril (PRINIVIL, ZESTRIL) 40 mg tablet, Take 40 mg by mouth daily., Disp: , Rfl:   ???  metoprolol tartrate (LOPRESSOR) 100 mg tablet, Take 100 mg by mouth twice daily., Disp: , Rfl:   ???  multivit-min-FA-lycopen-lutein (CENTRUM SILVER MEN) 300-600-300 mcg tab, Take 1 tablet by mouth daily., Disp: , Rfl:   ???  other medication, Prevagen 10 Take 1 tablet by mouth once daily., Disp: , Rfl:   ???  oxyCODONE/acetaminophen (PERCOCET; ENDOCET) 10/325 mg tablet, Take 1 tablet by mouth every 4 hours as needed , Disp: , Rfl: ???  senna/docusate (SENOKOT-S) 8.6/50 mg tablet, Take 1 tablet by mouth twice daily., Disp: , Rfl:   ???  simethicone (GAS-X) 80 mg chew tablet, Chew 80 mg by mouth every 6 hours as needed for Flatulence., Disp: , Rfl:   No Known Allergies  Physical Exam  Vitals:    10/27/17 1042   BP: (P) 147/72   Pulse: (P) 59   Resp: (P) 22   SpO2: (P) 97%   Weight: 88.5 kg (195 lb)   Height: 177.8 cm (70)     Oswestry Total Score:: 44  Pain Score: Six  Body mass index is 27.98 kg/m???.    General: Alert, cooperative, no distress  Head: Normocephalic, atraumatic  Eyes: Conjunctivae/corneas clear  Lungs: Unlabored respirations  Heart: Normal rate by palpation of pulse  Abdomen: Non-distended  Skin: Warm and dry to touch  Psychiatric: Mood and affect normal  Musculoskeletal: Moves all extremities  Neurological: Grossly intact         IMPRESSION:  1. Chronic intractable pain    2. S/P insertion of intrathecal pump          PLAN:    Absent S/S of infection.  Staples removed. Incisions cleansed with chloro prep and allowed to dry before applying dressing.   Keep incision dry with gauze and tegaderm for a week.   May shower and allow steristrip  to get wet thereafter. Pat dry. Do not submerge in water.   Allow steristrips to fall off on their own.   May apply ice pack to surgical site for comfort.   Gradually increase activity while following activity restrictions.  8 week post-operative activity restrictions; avoiding agressive bending/twisting, reaching overhead, lifting/pulling >10lb.   RTC in 1 week for replacement of intrathecal med.       ATTESTATION    I personally performed the key portions of the E/M visit, discussed case with resident and concur with resident documentation of history, physical exam, assessment, and treatment plan unless otherwise noted.    Staff name:  [name], MD Date:  10/28/2017

## 2017-10-28 DIAGNOSIS — Z09 Encounter for follow-up examination after completed treatment for conditions other than malignant neoplasm: Principal | ICD-10-CM

## 2017-10-28 DIAGNOSIS — G8929 Other chronic pain: ICD-10-CM

## 2017-10-28 DIAGNOSIS — Z9889 Other specified postprocedural states: ICD-10-CM

## 2017-11-04 ENCOUNTER — Encounter: Admit: 2017-11-04 | Discharge: 2017-11-04 | Payer: MEDICARE

## 2017-11-04 DIAGNOSIS — I10 Essential (primary) hypertension: Principal | ICD-10-CM

## 2017-11-04 MED ORDER — ZICONOTIDE 25MCG/ML IT PAIN PUMP SYR
Freq: Once | INTRATHECAL | 0 refills | Status: DC
Start: 2017-11-04 — End: 2017-11-04

## 2017-11-04 NOTE — Procedures
Intrathecal Pump Washout & Dose Increase    Initial pump interrogation information:   Drug/Concentration: Prialt 25 mcg/ml in sodium chloride PF 0.9%  Infusion Mode/Rate: Simple continuous at 2.74mcg per day  Reservoir Volume: 12.217 mL     The pump site was prepped with chloroprep and draped in a sterile manner. The refill template was placed over the pump and the refill port was located properly. A 22-gauge Huber needle with tubing (clamped) was advanced into the refill port. A 20 mL syringe was attached and aspiration was steady until 12.2 mL withdrawn and bubbles were noted. Aspirated medication wasted with Dante Gang, RN. The tubing was re-clamped and a new syringe containing 63mL of  Prialt 17mcg/ml in sodium chloride PF 0.9% was attached. The tubing was unclamped and the pump was filled easily without overpressure. The needle was removed; the area was cleaned, and a bandage was placed over the needle insertion site.     After reprogramming, the following settings were noted:   Drug/Concentration: Prialt 25 mcg/ml in sodium chloride PF 0.9%   Infusion Mode/Rate: Simple continuous at 2.2 mcg per day    Reservoir Volume: 19.3 mL   Low Reservoir Alarm Date: May 09, 2018

## 2017-11-04 NOTE — Progress Notes
[facility] CLINIC NOTE  Subjective     SUBJECTIVE:  Here for ITP washout   Flowonix ITP implanted late November 2018  Denies fevers, chills, positional headaches   Admits some incisional tenderness   Denies auditory/visual hallucinations, rashes   Not feeling pain relief from ITP yet        Review of Systems   Constitutional: Positive for diaphoresis and fatigue.   HENT: Positive for rhinorrhea and tinnitus.    Eyes: Positive for pain, redness and itching.   Respiratory: Positive for apnea.    Cardiovascular: Negative.    Gastrointestinal: Positive for abdominal pain and constipation.   Endocrine: Negative.    Genitourinary: Positive for frequency.   Musculoskeletal: Positive for arthralgias, back pain, joint swelling, myalgias, neck pain and neck stiffness.   Skin: Negative.    Allergic/Immunologic: Negative.    Neurological: Positive for headaches.   Hematological: Negative.    Psychiatric/Behavioral: Positive for agitation and confusion.       Current Outpatient Medications:   ???  amLODIPine (NORVASC) 10 mg tablet, Take 10 mg by mouth daily., Disp: , Rfl:   ???  buPROPion XL (WELLBUTRIN XL) 300 mg tablet, Take 300 mg by mouth every morning. Take with a 150mg  tablet for total daily dose of 450mg . Do not crush or chew., Disp: , Rfl:   ???  cloNIDine (CATAPRES) 0.1 mg tablet, Take 1 tablet by mouth twice daily., Disp: 7 tablet, Rfl: 3  ???  dextroamphetamine-amphetamine (ADDERALL) 10 mg tablet, Take 10 mg by mouth daily, Disp: , Rfl:   ???  duloxetine DR (CYMBALTA) 30 mg capsule, Take 30 mg by mouth daily., Disp: , Rfl:   ???  lisinopril (PRINIVIL, ZESTRIL) 40 mg tablet, Take 40 mg by mouth daily., Disp: , Rfl:   ???  metoprolol tartrate (LOPRESSOR) 100 mg tablet, Take 100 mg by mouth twice daily., Disp: , Rfl:   ???  multivit-min-FA-lycopen-lutein (CENTRUM SILVER MEN) 300-600-300 mcg tab, Take 1 tablet by mouth daily., Disp: , Rfl:   ???  other medication, Prevagen 10 Take 1 tablet by mouth once daily., Disp: , Rfl: ???  oxyCODONE/acetaminophen (PERCOCET; ENDOCET) 10/325 mg tablet, Take 1 tablet by mouth every 4 hours as needed , Disp: , Rfl:   ???  senna/docusate (SENOKOT-S) 8.6/50 mg tablet, Take 1 tablet by mouth twice daily., Disp: , Rfl:   ???  simethicone (GAS-X) 80 mg chew tablet, Chew 80 mg by mouth every 6 hours as needed for Flatulence., Disp: , Rfl:     Current Facility-Administered Medications:   ???  ziconotide(+) (PRIALT) 25 mcg/mL in 20 mL intrathecal infusion syr, , Intrathecal, ONCE, [name], MD  No Known Allergies  Physical Exam  Vitals:    11/04/17 0753   BP: 169/66   Pulse: 47   Resp: 22   SpO2: 100%   Weight: 90.7 kg (200 lb)   Height: 177.8 cm (70)     Oswestry Total Score:: 44  Pain Score: Six  Body mass index is 28.7 kg/m???.    General: Alert, cooperative, no distress  Head: Normocephalic, without obvious abnormality, atraumatic  Eyes: Conjunctivae/corneas clear  Lungs: Respirations regular and unlabored  Heart: Normal rate  Abdomen: Non-distended  Extremities: Normal, atraumatic  Skin: No obvious rashes or lesions  Neurologic: Grossly normal. Normal steady gait without use of assistive devices.   Psychiatric: Mood and affect normal  Back/Spine: No obvious deformity. Surgical scar over left low back/upper buttock. IPG palpated without swelling or fluid-filled pockets. Absent redness.  L-spine incision & right upper buttock ITP pocket site with spotty scabbing and scarring. Some steri-strips still in place. Mild pinkish coloration with mild swelling. Mild TTP.        IMPRESSION:  1. Chronic intractable pain    2. S/P insertion of intrathecal pump    3. Lumbar post-laminectomy syndrome          PLAN:    Flowonix ITP implanted late Nov '18 for chronic low back pain   Tolerating ITP well without complications  Continue to closely monitor post-op mild incisional swelling with tenderness   No relief noted from ITP thus far   ITP washout today with 10% dose increase RTC in 2w for FUV, wound check and possible dose increase

## 2017-11-05 ENCOUNTER — Ambulatory Visit: Admit: 2017-11-04 | Discharge: 2017-11-05 | Payer: MEDICARE

## 2017-11-05 DIAGNOSIS — G8929 Other chronic pain: Principal | ICD-10-CM

## 2017-11-05 DIAGNOSIS — Z9889 Other specified postprocedural states: ICD-10-CM

## 2017-11-08 ENCOUNTER — Encounter: Admit: 2017-11-08 | Discharge: 2017-11-08 | Payer: MEDICARE

## 2017-11-08 NOTE — Telephone Encounter
Patient called to report that he fell off ladder yesterday and went to the ED. He had no broken bones per x ray. RN advised follow up and patient understands.

## 2017-11-24 ENCOUNTER — Encounter: Admit: 2017-11-24 | Discharge: 2017-11-24 | Payer: MEDICARE

## 2017-11-24 ENCOUNTER — Ambulatory Visit: Admit: 2017-11-24 | Discharge: 2017-11-25 | Payer: MEDICARE

## 2017-11-24 DIAGNOSIS — I10 Essential (primary) hypertension: Principal | ICD-10-CM

## 2017-11-24 NOTE — Procedures
Intrathecal Pump Inquiry & Dose Adjustment    Initial pump interrogation information:   Drug/Concentration: Prialt 25 mcg/ml in sodium chloride PF 0.9%  Infusion Mode/Rate: Simple continuous at 2.2 mcg per day  Reservoir Volume: 17.543 mL     After reprogramming, the following settings were noted:   Drug/Concentration: Prialt 25 mcg/ml in sodium chloride PF 0.9%   Infusion Mode/Rate: Simple continuous at 2.4 mcg per day   Reservoir Volume: 17.543 mL   Low Reservoir Alarm Date: Apr 25, 2018

## 2017-11-24 NOTE — Progress Notes
[facility] CLINIC NOTE  Subjective     SUBJECTIVE:  Here for 2w FUV   ITP for low back   No relief noted since implant   Fell 33ft off ladder 11/07/17 landing on right side where ITP is. Went to ER [facility]) same day.   Brings CT L-spine radiology report from 11/07/17 w/him today along with ER records. No fractures per CT L-spine.   Per ER clinic note 12/10 - CT pelvis has been read and shows no acute findings. Fluid around the pain pump Right hip film - appears to be intact as does the pain pump with no obvious crack, broken lead etc.  Diagnosed with Soft tissue contusion and lumbar strain in ED  Denies sedation, leg numbness/weakness, auditory or visual hallucinations   Reports right buttock and leg pain have improved since fall        Review of Systems   Constitutional: Positive for diaphoresis and fatigue.   HENT: Positive for postnasal drip, rhinorrhea and tinnitus.    Eyes: Positive for pain, redness and itching.   Respiratory: Positive for apnea and cough.    Cardiovascular: Negative.    Gastrointestinal: Positive for abdominal pain and constipation. Negative for diarrhea.   Endocrine: Positive for heat intolerance.   Genitourinary: Positive for frequency.   Musculoskeletal: Positive for arthralgias, back pain, myalgias, neck pain and neck stiffness.   Skin: Negative.    Allergic/Immunologic: Negative.    Neurological: Positive for light-headedness and headaches.   Hematological: Negative.    Psychiatric/Behavioral: Positive for agitation, decreased concentration and sleep disturbance.       Current Outpatient Medications:   ???  amLODIPine (NORVASC) 10 mg tablet, Take 10 mg by mouth daily., Disp: , Rfl:   ???  buPROPion XL (WELLBUTRIN XL) 300 mg tablet, Take 300 mg by mouth every morning. Take with a 150mg  tablet for total daily dose of 450mg . Do not crush or chew., Disp: , Rfl:   ???  cloNIDine (CATAPRES) 0.1 mg tablet, Take 1 tablet by mouth twice daily., Disp: 7 tablet, Rfl: 3 ???  dextroamphetamine-amphetamine (ADDERALL) 10 mg tablet, Take 10 mg by mouth daily, Disp: , Rfl:   ???  duloxetine DR (CYMBALTA) 30 mg capsule, Take 30 mg by mouth daily., Disp: , Rfl:   ???  lisinopril (PRINIVIL, ZESTRIL) 40 mg tablet, Take 40 mg by mouth daily., Disp: , Rfl:   ???  metoprolol tartrate (LOPRESSOR) 100 mg tablet, Take 100 mg by mouth twice daily., Disp: , Rfl:   ???  multivit-min-FA-lycopen-lutein (CENTRUM SILVER MEN) 300-600-300 mcg tab, Take 1 tablet by mouth daily., Disp: , Rfl:   ???  other medication, Prevagen 10 Take 1 tablet by mouth once daily., Disp: , Rfl:   ???  oxyCODONE/acetaminophen (PERCOCET; ENDOCET) 10/325 mg tablet, Take 1 tablet by mouth every 4 hours as needed , Disp: , Rfl:   ???  senna/docusate (SENOKOT-S) 8.6/50 mg tablet, Take 1 tablet by mouth twice daily., Disp: , Rfl:   ???  simethicone (GAS-X) 80 mg chew tablet, Chew 80 mg by mouth every 6 hours as needed for Flatulence., Disp: , Rfl:   No Known Allergies  Physical Exam  Vitals:    11/24/17 1104   BP: (P) 147/58   Pulse: (P) 82   Resp: (P) 22   SpO2: (P) 100%   Weight: 90.7 kg (200 lb)   Height: 177.8 cm (70)     Oswestry Total Score:: 46     Body mass index is 28.7  kg/m???.    General: Alert, cooperative, no distress  Head: Normocephalic, without obvious abnormality, atraumatic  Eyes: Conjunctivae/corneas clear  Lungs: Respirations regular and unlabored  Heart: Normal rate  Abdomen: Non-distended  Extremities: Normal, atraumatic  Skin: No obvious rashes or lesions  Neurologic: Grossly normal. Normal steady gait without use of assistive devices.  Psychiatric: Mood and affect normal  Back/Spine: No obvious deformity. Mild swelling around ITP implanted in right upper buttock. Mild pinkish coloration over ITP and about an inch around in circumference of the pump. Equal warmth palpated bilaterally over the ITP compared to the other side. Absent redness, bruising, fluid-filled pockets. Borders of pump & port site easily palpated. IMPRESSION:  1. Chronic intractable pain    2. Presence of intrathecal pump          PLAN:    No S/S of ITP compromise.   No signs of ITP compromise per [facility] ER records   No relief of chronic low back pain since ITP implant  Tolerating the ITP well without side effects   Increase dosing 10%   Potential AE discussed. Encouraged to call our office if any such AE present.   RTC in 2w for FUV and possible ITP dose increase

## 2017-11-25 DIAGNOSIS — G8929 Other chronic pain: Principal | ICD-10-CM

## 2017-11-25 DIAGNOSIS — Z9689 Presence of other specified functional implants: ICD-10-CM

## 2017-12-07 ENCOUNTER — Encounter: Admit: 2017-12-07 | Discharge: 2017-12-07 | Payer: MEDICARE

## 2017-12-07 DIAGNOSIS — I10 Essential (primary) hypertension: Principal | ICD-10-CM

## 2017-12-08 ENCOUNTER — Ambulatory Visit: Admit: 2017-12-07 | Discharge: 2017-12-08 | Payer: MEDICARE

## 2017-12-08 DIAGNOSIS — G8929 Other chronic pain: Principal | ICD-10-CM

## 2017-12-09 ENCOUNTER — Encounter: Admit: 2017-12-09 | Discharge: 2017-12-09 | Payer: MEDICARE

## 2017-12-09 DIAGNOSIS — G8929 Other chronic pain: Principal | ICD-10-CM

## 2017-12-27 ENCOUNTER — Encounter: Admit: 2017-12-27 | Discharge: 2017-12-27 | Payer: MEDICARE

## 2017-12-27 ENCOUNTER — Ambulatory Visit: Admit: 2017-12-27 | Discharge: 2017-12-28 | Payer: MEDICARE

## 2017-12-27 DIAGNOSIS — Z978 Presence of other specified devices: ICD-10-CM

## 2017-12-27 DIAGNOSIS — I10 Essential (primary) hypertension: Principal | ICD-10-CM

## 2017-12-28 DIAGNOSIS — Z9689 Presence of other specified functional implants: Principal | ICD-10-CM

## 2017-12-28 DIAGNOSIS — G8929 Other chronic pain: Secondary | ICD-10-CM

## 2017-12-28 DIAGNOSIS — M961 Postlaminectomy syndrome, not elsewhere classified: ICD-10-CM

## 2018-01-11 ENCOUNTER — Ambulatory Visit: Admit: 2018-01-11 | Discharge: 2018-01-12 | Payer: MEDICARE

## 2018-01-11 ENCOUNTER — Encounter: Admit: 2018-01-11 | Discharge: 2018-01-11 | Payer: MEDICARE

## 2018-01-11 DIAGNOSIS — I10 Essential (primary) hypertension: Principal | ICD-10-CM

## 2018-01-12 DIAGNOSIS — Z9689 Presence of other specified functional implants: Principal | ICD-10-CM

## 2018-01-12 DIAGNOSIS — G8929 Other chronic pain: ICD-10-CM

## 2018-01-12 DIAGNOSIS — M961 Postlaminectomy syndrome, not elsewhere classified: ICD-10-CM

## 2018-01-25 ENCOUNTER — Ambulatory Visit: Admit: 2018-01-25 | Discharge: 2018-01-26 | Payer: MEDICARE

## 2018-01-25 ENCOUNTER — Encounter: Admit: 2018-01-25 | Discharge: 2018-01-25 | Payer: MEDICARE

## 2018-01-25 DIAGNOSIS — Z978 Presence of other specified devices: ICD-10-CM

## 2018-01-25 DIAGNOSIS — I10 Essential (primary) hypertension: Principal | ICD-10-CM

## 2018-01-26 DIAGNOSIS — Z9689 Presence of other specified functional implants: Principal | ICD-10-CM

## 2018-01-26 DIAGNOSIS — M961 Postlaminectomy syndrome, not elsewhere classified: ICD-10-CM

## 2018-01-26 DIAGNOSIS — G8929 Other chronic pain: ICD-10-CM

## 2018-02-14 ENCOUNTER — Ambulatory Visit: Admit: 2018-02-14 | Discharge: 2018-02-15 | Payer: MEDICARE

## 2018-02-14 ENCOUNTER — Encounter: Admit: 2018-02-14 | Discharge: 2018-02-14 | Payer: MEDICARE

## 2018-02-14 DIAGNOSIS — G8929 Other chronic pain: Secondary | ICD-10-CM

## 2018-02-14 DIAGNOSIS — Z978 Presence of other specified devices: ICD-10-CM

## 2018-02-14 DIAGNOSIS — I10 Essential (primary) hypertension: Principal | ICD-10-CM

## 2018-02-15 ENCOUNTER — Encounter: Admit: 2018-02-15 | Discharge: 2018-02-15 | Payer: MEDICARE

## 2018-02-15 DIAGNOSIS — I10 Essential (primary) hypertension: Principal | ICD-10-CM

## 2018-02-15 DIAGNOSIS — M961 Postlaminectomy syndrome, not elsewhere classified: ICD-10-CM

## 2018-02-15 DIAGNOSIS — Z9689 Presence of other specified functional implants: Principal | ICD-10-CM

## 2018-02-20 ENCOUNTER — Encounter: Admit: 2018-02-20 | Discharge: 2018-02-20 | Payer: MEDICARE

## 2018-03-02 ENCOUNTER — Encounter: Admit: 2018-03-02 | Discharge: 2018-03-02 | Payer: MEDICARE

## 2018-03-02 ENCOUNTER — Ambulatory Visit: Admit: 2018-03-02 | Discharge: 2018-03-03 | Payer: MEDICARE

## 2018-03-02 DIAGNOSIS — I10 Essential (primary) hypertension: Principal | ICD-10-CM

## 2018-03-02 DIAGNOSIS — M545 Low back pain: Principal | ICD-10-CM

## 2018-03-02 DIAGNOSIS — G8929 Other chronic pain: Principal | ICD-10-CM

## 2018-03-02 MED ORDER — METHYLPREDNISOLONE ACETATE 80 MG/ML IJ SUSP
80 mg | Freq: Once | INTRAMUSCULAR | 0 refills | Status: CP
Start: 2018-03-02 — End: ?
  Administered 2018-03-02: 19:00:00 80 mg via INTRAMUSCULAR

## 2018-03-02 MED ORDER — ZICONOTIDE 25MCG/ML IT PAIN PUMP SYR
Freq: Once | INTRATHECAL | 0 refills | Status: CP
Start: 2018-03-02 — End: ?
  Administered 2018-03-02: 17:00:00 20.000 mL via INTRATHECAL

## 2018-03-02 MED ORDER — IOPAMIDOL 41 % IT SOLN
2.5 mL | Freq: Once | EPIDURAL | 0 refills | Status: CP
Start: 2018-03-02 — End: ?
  Administered 2018-03-02: 19:00:00 2.5 mL via EPIDURAL

## 2018-03-03 DIAGNOSIS — M5416 Radiculopathy, lumbar region: Principal | ICD-10-CM

## 2018-03-03 DIAGNOSIS — Z9689 Presence of other specified functional implants: ICD-10-CM

## 2018-03-03 DIAGNOSIS — G8929 Other chronic pain: ICD-10-CM

## 2018-04-04 ENCOUNTER — Encounter: Admit: 2018-04-04 | Discharge: 2018-04-04 | Payer: MEDICARE

## 2018-04-04 ENCOUNTER — Ambulatory Visit: Admit: 2018-04-04 | Discharge: 2018-04-05 | Payer: MEDICARE

## 2018-04-04 DIAGNOSIS — Z978 Presence of other specified devices: ICD-10-CM

## 2018-04-04 DIAGNOSIS — I10 Essential (primary) hypertension: Principal | ICD-10-CM

## 2018-04-05 DIAGNOSIS — Z9689 Presence of other specified functional implants: ICD-10-CM

## 2018-04-05 DIAGNOSIS — G8929 Other chronic pain: Principal | ICD-10-CM

## 2018-04-05 DIAGNOSIS — M961 Postlaminectomy syndrome, not elsewhere classified: ICD-10-CM

## 2018-04-18 ENCOUNTER — Encounter: Admit: 2018-04-18 | Discharge: 2018-04-18 | Payer: MEDICARE

## 2018-04-18 ENCOUNTER — Ambulatory Visit: Admit: 2018-04-18 | Discharge: 2018-04-19 | Payer: MEDICARE

## 2018-04-18 DIAGNOSIS — Z978 Presence of other specified devices: ICD-10-CM

## 2018-04-18 DIAGNOSIS — I10 Essential (primary) hypertension: Principal | ICD-10-CM

## 2018-04-19 DIAGNOSIS — M961 Postlaminectomy syndrome, not elsewhere classified: ICD-10-CM

## 2018-04-19 DIAGNOSIS — G8929 Other chronic pain: Principal | ICD-10-CM

## 2018-04-19 DIAGNOSIS — Z9689 Presence of other specified functional implants: ICD-10-CM

## 2018-05-02 ENCOUNTER — Ambulatory Visit: Admit: 2018-05-02 | Discharge: 2018-05-03 | Payer: MEDICARE

## 2018-05-02 ENCOUNTER — Encounter: Admit: 2018-05-02 | Discharge: 2018-05-02 | Payer: MEDICARE

## 2018-05-02 DIAGNOSIS — I10 Essential (primary) hypertension: Principal | ICD-10-CM

## 2018-05-03 DIAGNOSIS — G8929 Other chronic pain: Principal | ICD-10-CM

## 2018-05-03 DIAGNOSIS — Z9689 Presence of other specified functional implants: ICD-10-CM

## 2018-05-03 DIAGNOSIS — M961 Postlaminectomy syndrome, not elsewhere classified: Secondary | ICD-10-CM

## 2018-05-26 ENCOUNTER — Ambulatory Visit: Admit: 2018-05-26 | Discharge: 2018-05-27 | Payer: MEDICARE

## 2018-05-26 ENCOUNTER — Encounter: Admit: 2018-05-26 | Discharge: 2018-05-26 | Payer: MEDICARE

## 2018-05-26 DIAGNOSIS — Z978 Presence of other specified devices: ICD-10-CM

## 2018-05-26 DIAGNOSIS — I10 Essential (primary) hypertension: Principal | ICD-10-CM

## 2018-05-26 MED ORDER — ZICONOTIDE 25MCG/ML IT PAIN PUMP SYR
Freq: Once | INTRATHECAL | 0 refills | Status: CP
Start: 2018-05-26 — End: ?
  Administered 2018-05-26: 16:00:00 20.000 mL via INTRATHECAL

## 2018-05-27 DIAGNOSIS — G8929 Other chronic pain: Principal | ICD-10-CM

## 2018-05-27 DIAGNOSIS — M961 Postlaminectomy syndrome, not elsewhere classified: ICD-10-CM

## 2018-05-27 DIAGNOSIS — Z9689 Presence of other specified functional implants: ICD-10-CM

## 2018-06-23 ENCOUNTER — Encounter: Admit: 2018-06-23 | Discharge: 2018-06-23 | Payer: MEDICARE

## 2018-06-23 DIAGNOSIS — I10 Essential (primary) hypertension: Principal | ICD-10-CM

## 2018-06-24 ENCOUNTER — Ambulatory Visit: Admit: 2018-06-23 | Discharge: 2018-06-24 | Payer: MEDICARE

## 2018-06-24 DIAGNOSIS — Z978 Presence of other specified devices: ICD-10-CM

## 2018-06-24 DIAGNOSIS — G8929 Other chronic pain: Principal | ICD-10-CM

## 2018-06-24 DIAGNOSIS — M961 Postlaminectomy syndrome, not elsewhere classified: ICD-10-CM

## 2018-07-21 ENCOUNTER — Encounter: Admit: 2018-07-21 | Discharge: 2018-07-21 | Payer: MEDICARE

## 2018-07-21 DIAGNOSIS — Z978 Presence of other specified devices: Secondary | ICD-10-CM

## 2018-07-21 DIAGNOSIS — I10 Essential (primary) hypertension: Principal | ICD-10-CM

## 2018-07-21 DIAGNOSIS — G8929 Other chronic pain: Secondary | ICD-10-CM

## 2018-07-22 ENCOUNTER — Ambulatory Visit: Admit: 2018-07-21 | Discharge: 2018-07-22 | Payer: MEDICARE

## 2018-07-22 DIAGNOSIS — M961 Postlaminectomy syndrome, not elsewhere classified: Principal | ICD-10-CM

## 2018-08-04 ENCOUNTER — Encounter: Admit: 2018-08-04 | Discharge: 2018-08-04 | Payer: MEDICARE

## 2018-08-04 ENCOUNTER — Ambulatory Visit: Admit: 2018-08-04 | Discharge: 2018-08-05 | Payer: MEDICARE

## 2018-08-04 DIAGNOSIS — I10 Essential (primary) hypertension: Principal | ICD-10-CM

## 2018-08-04 DIAGNOSIS — Z978 Presence of other specified devices: Secondary | ICD-10-CM

## 2018-08-04 MED ORDER — ZICONOTIDE 25MCG/ML IT PAIN PUMP SYR
Freq: Once | INTRATHECAL | 0 refills | Status: CP
Start: 2018-08-04 — End: ?
  Administered 2018-08-04: 18:00:00 20.000 mL via INTRATHECAL

## 2018-08-05 DIAGNOSIS — M961 Postlaminectomy syndrome, not elsewhere classified: ICD-10-CM

## 2018-08-05 DIAGNOSIS — Z9689 Presence of other specified functional implants: ICD-10-CM

## 2018-08-05 DIAGNOSIS — G8929 Other chronic pain: Principal | ICD-10-CM

## 2018-08-24 ENCOUNTER — Encounter: Admit: 2018-08-24 | Discharge: 2018-08-24 | Payer: MEDICARE

## 2018-08-24 ENCOUNTER — Ambulatory Visit: Admit: 2018-08-24 | Discharge: 2018-08-25 | Payer: MEDICARE

## 2018-08-24 DIAGNOSIS — I10 Essential (primary) hypertension: Principal | ICD-10-CM

## 2018-08-24 DIAGNOSIS — Z9689 Presence of other specified functional implants: ICD-10-CM

## 2018-08-25 DIAGNOSIS — Z9889 Other specified postprocedural states: Principal | ICD-10-CM

## 2018-09-28 ENCOUNTER — Encounter: Admit: 2018-09-28 | Discharge: 2018-09-28 | Payer: MEDICARE

## 2018-09-28 DIAGNOSIS — I10 Essential (primary) hypertension: Principal | ICD-10-CM

## 2018-09-28 MED ORDER — FENTANYL 25MCG/ML IT PAIN PUMP SYR
Freq: Once | INTRATHECAL | 0 refills | Status: AC
Start: 2018-09-28 — End: ?

## 2018-09-29 ENCOUNTER — Ambulatory Visit: Admit: 2018-09-28 | Discharge: 2018-09-29 | Payer: MEDICARE

## 2018-09-29 DIAGNOSIS — G894 Chronic pain syndrome: ICD-10-CM

## 2018-09-29 DIAGNOSIS — Z9689 Presence of other specified functional implants: Secondary | ICD-10-CM

## 2018-09-29 DIAGNOSIS — M47812 Spondylosis without myelopathy or radiculopathy, cervical region: ICD-10-CM

## 2018-09-29 DIAGNOSIS — Z9889 Other specified postprocedural states: Principal | ICD-10-CM

## 2018-10-09 ENCOUNTER — Encounter: Admit: 2018-10-09 | Discharge: 2018-10-09 | Payer: MEDICARE

## 2018-10-09 ENCOUNTER — Ambulatory Visit: Admit: 2018-10-09 | Discharge: 2018-10-10 | Payer: MEDICARE

## 2018-10-09 DIAGNOSIS — Z978 Presence of other specified devices: Secondary | ICD-10-CM

## 2018-10-09 DIAGNOSIS — I10 Essential (primary) hypertension: Principal | ICD-10-CM

## 2018-10-10 DIAGNOSIS — G894 Chronic pain syndrome: Principal | ICD-10-CM

## 2018-10-10 DIAGNOSIS — M961 Postlaminectomy syndrome, not elsewhere classified: ICD-10-CM

## 2018-11-06 ENCOUNTER — Ambulatory Visit: Admit: 2018-11-06 | Discharge: 2018-11-06 | Payer: MEDICARE

## 2018-11-06 ENCOUNTER — Encounter: Admit: 2018-11-06 | Discharge: 2018-11-06 | Payer: MEDICARE

## 2018-11-06 DIAGNOSIS — G894 Chronic pain syndrome: ICD-10-CM

## 2018-11-06 DIAGNOSIS — Z451 Encounter for adjustment and management of infusion pump: ICD-10-CM

## 2018-11-06 DIAGNOSIS — T85192A Other mechanical complication of implanted electronic neurostimulator (electrode) of spinal cord, initial encounter: ICD-10-CM

## 2018-11-06 DIAGNOSIS — I10 Essential (primary) hypertension: Principal | ICD-10-CM

## 2018-11-06 DIAGNOSIS — M961 Postlaminectomy syndrome, not elsewhere classified: ICD-10-CM

## 2018-11-06 DIAGNOSIS — Z978 Presence of other specified devices: Principal | ICD-10-CM

## 2018-11-06 MED ORDER — FENTANYL 50MCG/ML IT PAIN PUMP SYR
Freq: Once | INTRATHECAL | 0 refills | Status: CP
Start: 2018-11-06 — End: ?
  Administered 2018-11-06: 20:00:00 20.000 mL via INTRATHECAL

## 2018-11-07 ENCOUNTER — Encounter: Admit: 2018-11-07 | Discharge: 2018-11-07 | Payer: MEDICARE

## 2018-11-08 ENCOUNTER — Ambulatory Visit: Admit: 2018-11-08 | Discharge: 2018-11-08 | Payer: MEDICARE

## 2018-11-08 ENCOUNTER — Encounter: Admit: 2018-11-08 | Discharge: 2018-11-08 | Payer: MEDICARE

## 2018-11-08 DIAGNOSIS — M47812 Spondylosis without myelopathy or radiculopathy, cervical region: Secondary | ICD-10-CM

## 2018-11-08 DIAGNOSIS — Z451 Encounter for adjustment and management of infusion pump: Principal | ICD-10-CM

## 2018-11-08 DIAGNOSIS — T85192A Other mechanical complication of implanted electronic neurostimulator (electrode) of spinal cord, initial encounter: ICD-10-CM

## 2018-11-08 DIAGNOSIS — G894 Chronic pain syndrome: Principal | ICD-10-CM

## 2018-11-08 DIAGNOSIS — M961 Postlaminectomy syndrome, not elsewhere classified: ICD-10-CM

## 2018-11-09 ENCOUNTER — Encounter: Admit: 2018-11-09 | Discharge: 2018-11-09 | Payer: MEDICARE

## 2018-11-10 ENCOUNTER — Encounter: Admit: 2018-11-10 | Discharge: 2018-11-10 | Payer: MEDICARE

## 2018-11-13 ENCOUNTER — Encounter: Admit: 2018-11-13 | Discharge: 2018-11-13 | Payer: MEDICARE

## 2018-11-13 DIAGNOSIS — I10 Essential (primary) hypertension: Principal | ICD-10-CM

## 2018-11-13 DIAGNOSIS — M961 Postlaminectomy syndrome, not elsewhere classified: Secondary | ICD-10-CM

## 2018-11-14 ENCOUNTER — Ambulatory Visit: Admit: 2018-11-13 | Discharge: 2018-11-14 | Payer: MEDICARE

## 2018-11-14 DIAGNOSIS — M461 Sacroiliitis, not elsewhere classified: ICD-10-CM

## 2018-11-14 DIAGNOSIS — Z451 Encounter for adjustment and management of infusion pump: Principal | ICD-10-CM

## 2018-11-14 DIAGNOSIS — Z9889 Other specified postprocedural states: ICD-10-CM

## 2018-11-14 DIAGNOSIS — Z9689 Presence of other specified functional implants: ICD-10-CM

## 2018-11-14 DIAGNOSIS — M47812 Spondylosis without myelopathy or radiculopathy, cervical region: Secondary | ICD-10-CM

## 2018-11-14 DIAGNOSIS — T85192A Other mechanical complication of implanted electronic neurostimulator (electrode) of spinal cord, initial encounter: ICD-10-CM

## 2018-11-14 DIAGNOSIS — M47816 Spondylosis without myelopathy or radiculopathy, lumbar region: Secondary | ICD-10-CM

## 2018-11-28 ENCOUNTER — Encounter: Admit: 2018-11-28 | Discharge: 2018-11-28 | Payer: MEDICARE

## 2018-11-28 ENCOUNTER — Ambulatory Visit: Admit: 2018-11-28 | Discharge: 2018-11-29 | Payer: MEDICARE

## 2018-11-28 DIAGNOSIS — Z9689 Presence of other specified functional implants: Secondary | ICD-10-CM

## 2018-11-28 DIAGNOSIS — I10 Essential (primary) hypertension: Principal | ICD-10-CM

## 2018-11-28 MED ORDER — IOPAMIDOL 41 % IT SOLN
2.5 mL | Freq: Once | EPIDURAL | 0 refills | Status: DC
Start: 2018-11-28 — End: 2018-11-28

## 2018-11-28 MED ORDER — METHYLPREDNISOLONE ACETATE 80 MG/ML IJ SUSP
80 mg | Freq: Once | INTRAMUSCULAR | 0 refills | Status: DC
Start: 2018-11-28 — End: 2018-11-28

## 2018-11-28 MED ORDER — BUPIVACAINE (PF) 0.5 % (5 MG/ML) IJ SOLN
10 mL | Freq: Once | INTRAMUSCULAR | 0 refills | Status: CP
Start: 2018-11-28 — End: ?
  Administered 2018-11-28: 17:00:00 10 mL via INTRAMUSCULAR

## 2018-11-29 ENCOUNTER — Ambulatory Visit: Admit: 2018-11-28 | Discharge: 2018-11-29 | Payer: MEDICARE

## 2018-11-29 DIAGNOSIS — M461 Sacroiliitis, not elsewhere classified: ICD-10-CM

## 2018-11-29 DIAGNOSIS — Z9889 Other specified postprocedural states: ICD-10-CM

## 2018-11-29 DIAGNOSIS — M47812 Spondylosis without myelopathy or radiculopathy, cervical region: Principal | ICD-10-CM

## 2018-11-29 DIAGNOSIS — T85192A Other mechanical complication of implanted electronic neurostimulator (electrode) of spinal cord, initial encounter: ICD-10-CM

## 2018-11-29 DIAGNOSIS — Z451 Encounter for adjustment and management of infusion pump: Principal | ICD-10-CM

## 2018-11-29 DIAGNOSIS — M47816 Spondylosis without myelopathy or radiculopathy, lumbar region: ICD-10-CM

## 2018-11-29 DIAGNOSIS — M961 Postlaminectomy syndrome, not elsewhere classified: ICD-10-CM

## 2018-11-29 DIAGNOSIS — I10 Essential (primary) hypertension: ICD-10-CM

## 2018-11-29 DIAGNOSIS — Z9689 Presence of other specified functional implants: ICD-10-CM

## 2018-12-01 ENCOUNTER — Encounter: Admit: 2018-12-01 | Discharge: 2018-12-01 | Payer: MEDICARE

## 2019-01-01 ENCOUNTER — Encounter: Admit: 2019-01-01 | Discharge: 2019-01-01 | Payer: MEDICARE

## 2019-01-01 DIAGNOSIS — I10 Essential (primary) hypertension: Principal | ICD-10-CM

## 2019-01-01 MED ORDER — FENTANYL 50MCG/ML IT PAIN PUMP SYR
Freq: Once | INTRATHECAL | 0 refills | Status: CP
Start: 2019-01-01 — End: ?
  Administered 2019-01-01: 21:00:00 20.000 mL via INTRATHECAL

## 2019-01-01 NOTE — Patient Instructions
It was nice to see you today. Thank you for choosing to visit our clinic.       Your time is important and if you had to wait today, we do apologize. Our goal is to run exactly on time; however, on occasion, we get behind in clinic due to unexpected patient issues. Thank you for your patience.      General Instructions:     ??? Scheduling: Please call our scheduling coordinators at [phone] to set up an appointment with [place].  ??? How to reach [place]: If you have a question about your care, please send a message to my nurse, Cherie, on MyChart or leave a voicemail at [phone].She will return your call at her earliest availability.  ??? How to get a medication refill: Please use the MyChart Refill request or contact your pharmacy directly to request medication refills.  ??? How to receive your test results: If you have signed up for MyChart, you will receive your test results and messages from me this way. Otherwise, you will get a phone call or letter. If you are expecting results and have not heard from my office within 2 weeks of your testing, please send a MyChart message or call my office.  ??? Appointment Reminders on your cell phone: Make sure we have your cell phone number, and Text  to [phone].  ??? Support for many chronic illnesses is available through [employer]: [email or web address] or [phone].  ??? For questions on nights, weekends or holidays, call the Operator at [phone], and ask for the doctor on call for Anesthesia Pain Management.      Again, thank you for coming in today.    ________________________________________________________________

## 2019-01-02 ENCOUNTER — Ambulatory Visit: Admit: 2019-01-01 | Discharge: 2019-01-02 | Payer: MEDICARE

## 2019-01-02 DIAGNOSIS — Z451 Encounter for adjustment and management of infusion pump: Principal | ICD-10-CM

## 2019-01-02 DIAGNOSIS — M961 Postlaminectomy syndrome, not elsewhere classified: Secondary | ICD-10-CM

## 2019-01-02 DIAGNOSIS — G894 Chronic pain syndrome: ICD-10-CM

## 2019-01-05 ENCOUNTER — Encounter: Admit: 2019-01-05 | Discharge: 2019-01-05 | Payer: MEDICARE

## 2019-01-05 ENCOUNTER — Ambulatory Visit: Admit: 2019-01-05 | Discharge: 2019-01-06 | Payer: MEDICARE

## 2019-01-05 DIAGNOSIS — Z451 Encounter for adjustment and management of infusion pump: Principal | ICD-10-CM

## 2019-01-05 DIAGNOSIS — I10 Essential (primary) hypertension: Principal | ICD-10-CM

## 2019-01-05 DIAGNOSIS — G894 Chronic pain syndrome: Principal | ICD-10-CM

## 2019-01-06 DIAGNOSIS — G894 Chronic pain syndrome: ICD-10-CM

## 2019-01-06 DIAGNOSIS — M961 Postlaminectomy syndrome, not elsewhere classified: ICD-10-CM

## 2019-01-09 ENCOUNTER — Encounter: Admit: 2019-01-09 | Discharge: 2019-01-09 | Payer: MEDICARE

## 2019-01-09 DIAGNOSIS — I10 Essential (primary) hypertension: Principal | ICD-10-CM

## 2019-01-12 ENCOUNTER — Ambulatory Visit: Admit: 2019-01-12 | Discharge: 2019-01-13 | Payer: MEDICARE

## 2019-01-12 ENCOUNTER — Encounter: Admit: 2019-01-12 | Discharge: 2019-01-12 | Payer: MEDICARE

## 2019-01-12 DIAGNOSIS — I10 Essential (primary) hypertension: Principal | ICD-10-CM

## 2019-01-12 MED ORDER — LIDOCAINE (PF) 10 MG/ML (1 %) IJ SOLN
2 mL | Freq: Once | INTRAMUSCULAR | 0 refills | Status: DC
Start: 2019-01-12 — End: 2019-01-12

## 2019-01-12 MED ORDER — BUPIVACAINE (PF) 0.5 % (5 MG/ML) IJ SOLN
3 mL | Freq: Once | INTRAMUSCULAR | 0 refills | Status: CP
Start: 2019-01-12 — End: ?
  Administered 2019-01-12: 19:00:00 3 mL via INTRAMUSCULAR

## 2019-01-12 NOTE — Procedures
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        [place] [facility] SPINE INJECT PVRT FACET MBB JT CRV/THOR  Procedure: medial branch block    Laterality: bilateral    Location: cervical - C3-4, C4-5 and C5-6      Consent:   Consent obtained: verbal and written  Consent given by: patient  Risks discussed: allergic reaction, bleeding, infection, nerve damage, no change or worsening in pain and reaction to medication  Alternatives discussed: alternative treatment, delayed treatment and no treatment  Discussed with patient the purpose of the treatment/procedure, other ways of treating my condition, including no treatment/ procedure and the risks and benefits of the alternatives. Patient has decided to proceed with treatment/procedure.        Universal Protocol:  Relevant documents: relevant documents present and verified  Test results: test results available and properly labeled  Imaging studies: imaging studies available  Required items: required blood products, implants, devices, and special equipment available  Site marked: the operative site was marked  Patient identity confirmed: Patient identify confirmed verbally with patient.        Time out: Immediately prior to procedure a time out was called to verify the correct patient, procedure, equipment, support staff and site/side marked as required      Procedures Details:   Indications: pain and diagnostic evaluation   Prep: Betadine  Patient position: prone  Estimated Blood Loss: minimal  Specimens: none  Amount Injected:   C3-4: 1mL  C4-5: 1mL  C5-6: 1mL    Number of Levels: 3  Approach: midline  Guidance: fluoroscopy  Contrast: Procedure confirmed with contrast under live fluoroscopy.  Needle size: 25 G

## 2019-01-13 ENCOUNTER — Ambulatory Visit: Admit: 2019-01-12 | Discharge: 2019-01-13 | Payer: MEDICARE

## 2019-01-13 DIAGNOSIS — Z9889 Other specified postprocedural states: ICD-10-CM

## 2019-01-13 DIAGNOSIS — I10 Essential (primary) hypertension: ICD-10-CM

## 2019-01-13 DIAGNOSIS — M47812 Spondylosis without myelopathy or radiculopathy, cervical region: Principal | ICD-10-CM

## 2019-01-13 DIAGNOSIS — G894 Chronic pain syndrome: ICD-10-CM

## 2019-01-13 DIAGNOSIS — Z9682 Presence of neurostimulator: ICD-10-CM

## 2019-01-15 ENCOUNTER — Encounter: Admit: 2019-01-15 | Discharge: 2019-01-15 | Payer: MEDICARE

## 2019-01-15 ENCOUNTER — Ambulatory Visit: Admit: 2019-01-15 | Discharge: 2019-01-16 | Payer: MEDICARE

## 2019-01-15 DIAGNOSIS — I10 Essential (primary) hypertension: Principal | ICD-10-CM

## 2019-01-16 DIAGNOSIS — Z978 Presence of other specified devices: ICD-10-CM

## 2019-01-16 DIAGNOSIS — M47812 Spondylosis without myelopathy or radiculopathy, cervical region: ICD-10-CM

## 2019-01-16 DIAGNOSIS — G894 Chronic pain syndrome: Principal | ICD-10-CM

## 2019-02-02 ENCOUNTER — Encounter: Admit: 2019-02-02 | Discharge: 2019-02-02 | Payer: MEDICARE

## 2019-02-02 DIAGNOSIS — G894 Chronic pain syndrome: Principal | ICD-10-CM

## 2019-02-07 ENCOUNTER — Ambulatory Visit: Admit: 2019-02-07 | Discharge: 2019-02-07 | Payer: MEDICARE

## 2019-02-07 ENCOUNTER — Encounter: Admit: 2019-02-07 | Discharge: 2019-02-07 | Payer: MEDICARE

## 2019-02-07 ENCOUNTER — Ambulatory Visit: Admit: 2019-02-07 | Discharge: 2019-02-08 | Payer: MEDICARE

## 2019-02-07 DIAGNOSIS — I10 Essential (primary) hypertension: Principal | ICD-10-CM

## 2019-02-07 DIAGNOSIS — Z451 Encounter for adjustment and management of infusion pump: Principal | ICD-10-CM

## 2019-02-07 DIAGNOSIS — M961 Postlaminectomy syndrome, not elsewhere classified: ICD-10-CM

## 2019-02-07 DIAGNOSIS — R52 Pain, unspecified: ICD-10-CM

## 2019-02-07 DIAGNOSIS — M47812 Spondylosis without myelopathy or radiculopathy, cervical region: ICD-10-CM

## 2019-02-07 DIAGNOSIS — I499 Cardiac arrhythmia, unspecified: ICD-10-CM

## 2019-02-07 DIAGNOSIS — G894 Chronic pain syndrome: ICD-10-CM

## 2019-02-07 MED ORDER — FENTANYL CITRATE (PF) 50 MCG/ML IJ SOLN
100 ug | INTRAVENOUS | 0 refills | Status: DC | PRN
Start: 2019-02-07 — End: 2019-03-02

## 2019-02-07 MED ORDER — MIDAZOLAM 1 MG/ML IJ SOLN
1-2 mg | INTRAVENOUS | 0 refills | Status: DC | PRN
Start: 2019-02-07 — End: 2019-03-02

## 2019-02-07 MED ORDER — INTRATHECAL PAIN PUMP SYR- NON UKH PHARMACY
1 | Freq: Once | INTRATHECAL | 0 refills | Status: CP
Start: 2019-02-07 — End: ?
  Administered 2019-02-07: 17:00:00 1 via INTRATHECAL

## 2019-02-07 MED ORDER — LIDOCAINE (PF) 10 MG/ML (1 %) IJ SOLN
2 mL | Freq: Once | INTRAMUSCULAR | 0 refills | Status: CP
Start: 2019-02-07 — End: ?

## 2019-02-07 NOTE — Progress Notes
[facility]ENTER CLINIC NOTE       SUBJECTIVE:    Presents for ITP refill  Presents with his wife   ITP for chronic low back pain   Has not noticed much relief from ITP   Trial with Fentanyl at OSH provided great relief. Itched really bad though.  Intermittent mild itching with current intrathecal fentanyl   Notices it but it goes away  Itches occurs around [place] SCS over left low back   Aches and pains in elbows, wrists, etc - places other than low back   Has thought some about ITP explant and going back to oral pain medication to manage full body pain  Not wanting to pursue that at this time though           Review of Systems   Constitutional: Positive for fatigue.   HENT: Negative.    Eyes: Negative.    Respiratory: Negative.    Cardiovascular: Negative.    Gastrointestinal: Negative.    Endocrine: Negative.    Genitourinary: Negative.    Musculoskeletal: Positive for back pain.   Skin: Negative.    Allergic/Immunologic: Negative.    Neurological: Negative.    Hematological: Negative.    Psychiatric/Behavioral: Negative.    All other systems reviewed and are negative.      Current Outpatient Medications:   ???  amLODIPine (NORVASC) 10 mg tablet, Take 10 mg by mouth daily., Disp: , Rfl:   ???  dextroamphetamine-amphetamine (ADDERALL) 10 mg tablet, Take 20 mg by mouth daily  , Disp: , Rfl:   ???  doxazosin (CARDURA) 2 mg tablet, Take 2 mg by mouth at bedtime daily., Disp: , Rfl:   ???  ELIQUIS 5 mg tablet, Take 5 mg by mouth twice daily., Disp: , Rfl:   ???  fentaNYL citrate PF (SUBLIMAZE) 50 mcg/mL 50 mcg/mL in 20 mL intrathecal infusion syr, by Intrathecal route Continuous. Please call [facility] (Dr. Janyth Contes and [facility]) for settings., Disp: , Rfl:   ???  fluticasone propionate (FLONASE) 50 mcg/actuation nasal spray, suspension, Apply 50 sprays into nose as directed as Needed., Disp: , Rfl:   ???  fluticasone/salmeterol (ADVAIR DISKUS) 100-50 mcg inhalation disk, Inhale 1 puff by mouth into the lungs every 12 hours., Disp: , Rfl:   ???  metoprolol tartrate (LOPRESSOR) 100 mg tablet, Take 100 mg by mouth twice daily., Disp: , Rfl:   ???  multivit-min-FA-lycopen-lutein (CENTRUM SILVER MEN) 300-600-300 mcg tab, Take 1 tablet by mouth daily., Disp: , Rfl:   ???  other medication, Prevagen 10 Take 1 tablet by mouth once daily., Disp: , Rfl:   ???  senna/docusate (SENOKOT-S) 8.6/50 mg tablet, Take 1 tablet by mouth twice daily., Disp: , Rfl:   ???  temazepam (RESTORIL) 15 mg capsule, Take 15 mg by mouth at bedtime as needed., Disp: , Rfl:   ???  valsartan (DIOVAN) 160 mg tablet, Take 160 mg by mouth daily., Disp: , Rfl:   No Known Allergies  Physical Exam  Vitals:    02/07/19 1100   BP: (!) 160/71   BP Source: Arm, Left Upper   Patient Position: Sitting   Pulse: 56   Resp: 20   SpO2: 100%   Weight: 90.7 kg (200 lb)   Height: 177.8 cm (70)   PainSc: Eight     Oswestry Total Score:: 48  Pain Score: Eight  Body mass index is 28.7 kg/m???.    General: Alert, cooperative, no distress  Head: Normocephalic, without  obvious abnormality, atraumatic  Eyes: Conjunctivae/corneas clear  Lungs: Respirations regular and unlabored  Heart: Normal rate  Abdomen: Non-distended  Extremities: Normal, atraumatic  Skin: No obvious rashes or lesions  Neurologic: Grossly normal. Normal steady gait without use of assistive devices.    Psychiatric: Mood and affect normal  Back/Spine: No obvious deformity. IPG Over left low back. ITP over right upper buttock. Absent redness, swelling, bruising, drainage, fluid-filled pockets, rashes.          IMPRESSION:  1. Adjustment and management of infusion pump    2. Chronic pain syndrome    3. Postlaminectomy syndrome, lumbar region          PLAN:     ITP Refilled   Demand bolus of Fentanyl 13mcg/9min in office. Tolerated without itching or side effects. Pt reported maybe some relief about 20 minutes after demand bolus administration

## 2019-02-07 NOTE — Discharge Instructions - Supplementary Instructions
RADIOFREQUENCY ABLATION POST PROCEDURE INSTRUCTIONS  ? Go directly home and rest. DO NOT drive today.  ? If you have a dressing or bandage on, you may remove it in 12 hours. You may shower tomorrow.   ? Apply ice to the procedure site at 20 minute intervals frequently for the next 24 hours.  ? If you had an IV for your procedure and a lump or redness occurs at the site, you may apply a warm, moist compress for four times daily for 2-3 days.  ? If you had sedation for your procedure:  ? The anesthetic may make you drowsy and slow to react for up to 12 hours.  ? For the next 12 hours do not consume alcohol, drive, operate machinery, sign legal documents, or work.  ? Rest at home today.  ? A responsible adult needs to stay with you today and overnight.  ? Start with clear liquids and advance as tolerated.  ? Resume all previous medication unless directed not to.  ? It is not uncommon to experience an increase in pain for several days after the procedure.  ? The beneficial effects from the radiofrequency procedure may take several weeks to be noticed.  ? There should be minimal drainage and no swelling or redness at the injection sites. You should not experience a severe headache. You should not run a fever over 101???F. If any of these occur, call to report this to a nurse at [phone]. If you are calling after 4PM or on weekends/holidays, call    [phone] and ask to have the on-call pain management resident paged or go to your local emergency room.  ? Follow up appointment as needed.   Other instructions:___________________________________________________________________  ______________________________________________________________________________

## 2019-02-22 ENCOUNTER — Encounter: Admit: 2019-02-22 | Discharge: 2019-02-22 | Payer: MEDICARE

## 2019-03-01 ENCOUNTER — Encounter: Admit: 2019-03-01 | Discharge: 2019-03-01 | Payer: MEDICARE

## 2019-03-01 NOTE — Telephone Encounter
Returned call from pt regarding question regarding appt scheduled for 03/02/2019 at 1200.  Spoke with pt's wife instructing her regarding check-in time, temperature check at the main entrance, and no visitors.  Pt's wife verbalized understanding.

## 2019-03-02 ENCOUNTER — Encounter: Admit: 2019-03-02 | Discharge: 2019-03-02 | Payer: MEDICARE

## 2019-03-02 ENCOUNTER — Ambulatory Visit: Admit: 2019-03-02 | Discharge: 2019-03-03 | Payer: MEDICARE

## 2019-03-02 DIAGNOSIS — I10 Essential (primary) hypertension: Principal | ICD-10-CM

## 2019-03-02 DIAGNOSIS — I499 Cardiac arrhythmia, unspecified: ICD-10-CM

## 2019-03-02 MED ORDER — FENTANYL 50MCG/ML IT PAIN PUMP SYR
Freq: Once | INTRATHECAL | 0 refills | Status: CP
Start: 2019-03-02 — End: ?
  Administered 2019-03-02: 18:00:00 20.000 mL via INTRATHECAL

## 2019-03-02 MED ORDER — EPINEPHRINE 0.3 MG/0.3 ML IJ ATIN
INTRAMUSCULAR | 0 refills | 30.00000 days | Status: AC
Start: 2019-03-02 — End: ?

## 2019-03-02 NOTE — Patient Instructions
It was nice to see you today. Thank you for choosing to visit our clinic.       Your time is important and if you had to wait today, we do apologize. Our goal is to run exactly on time; however, on occasion, we get behind in clinic due to unexpected patient issues. Thank you for your patience.      General Instructions:     ??? Scheduling: Please call our scheduling coordinators at [phone] to set up an appointment with [place].  ??? How to reach [place]: If you have a question about your care, please send a message to my nurse, Cherie, on MyChart or leave a voicemail at [phone].She will return your call at her earliest availability.  ??? How to get a medication refill: Please use the MyChart Refill request or contact your pharmacy directly to request medication refills.  ??? How to receive your test results: If you have signed up for MyChart, you will receive your test results and messages from me this way. Otherwise, you will get a phone call or letter. If you are expecting results and have not heard from my office within 2 weeks of your testing, please send a MyChart message or call my office.  ??? Appointment Reminders on your cell phone: Make sure we have your cell phone number, and Text  to [phone].  ??? Support for many chronic illnesses is available through [employer]: [email or web address] or [phone].  ??? For questions on nights, weekends or holidays, call the Operator at [phone], and ask for the doctor on call for Anesthesia Pain Management.      Again, thank you for coming in today.    ________________________________________________________________

## 2019-03-03 DIAGNOSIS — M961 Postlaminectomy syndrome, not elsewhere classified: Secondary | ICD-10-CM

## 2019-03-03 DIAGNOSIS — G894 Chronic pain syndrome: Principal | ICD-10-CM

## 2019-03-03 DIAGNOSIS — Z978 Presence of other specified devices: ICD-10-CM

## 2019-03-05 ENCOUNTER — Encounter: Admit: 2019-03-05 | Discharge: 2019-03-05 | Payer: MEDICARE

## 2019-03-12 ENCOUNTER — Encounter: Admit: 2019-03-12 | Discharge: 2019-03-12 | Payer: MEDICARE

## 2019-03-12 DIAGNOSIS — G894 Chronic pain syndrome: Principal | ICD-10-CM

## 2019-03-13 ENCOUNTER — Encounter: Admit: 2019-03-13 | Discharge: 2019-03-13 | Payer: MEDICARE

## 2019-03-14 ENCOUNTER — Encounter: Admit: 2019-03-14 | Discharge: 2019-03-14 | Payer: MEDICARE

## 2019-03-15 ENCOUNTER — Encounter: Admit: 2019-03-15 | Discharge: 2019-03-15 | Payer: MEDICARE

## 2019-03-15 ENCOUNTER — Ambulatory Visit: Admit: 2019-03-15 | Discharge: 2019-03-16 | Payer: MEDICARE

## 2019-03-15 DIAGNOSIS — I10 Essential (primary) hypertension: Principal | ICD-10-CM

## 2019-03-15 DIAGNOSIS — I499 Cardiac arrhythmia, unspecified: ICD-10-CM

## 2019-03-15 DIAGNOSIS — M47817 Spondylosis without myelopathy or radiculopathy, lumbosacral region: Principal | ICD-10-CM

## 2019-03-15 NOTE — Progress Notes
[facility] CLINIC NOTE       SUBJECTIVE:   -Follow up for chronic pain in the neck and lower back  -Has had improvement in headaches and neck pain after RFA  -Has had some pain in the right shoulder pain  -This improved with shutting off SCS  -Recently started on eliquis  -Improved pain relief with fentanyl ITP  -       Review of Systems   Constitutional: Negative.    HENT: Negative.    Eyes: Negative.    Respiratory: Negative.    Cardiovascular: Negative.    Gastrointestinal: Negative.    Endocrine: Negative.    Genitourinary: Negative.    Musculoskeletal: Positive for back pain. Negative for gait problem.   Skin: Negative.    Allergic/Immunologic: Negative.    Neurological: Negative.    Hematological: Negative.    Psychiatric/Behavioral: Negative.    All other systems reviewed and are negative.      Current Outpatient Medications:   ???  amLODIPine (NORVASC) 10 mg tablet, Take 10 mg by mouth daily., Disp: , Rfl:   ???  dextroamphetamine-amphetamine (ADDERALL) 10 mg tablet, Take 20 mg by mouth daily  , Disp: , Rfl:   ???  doxazosin (CARDURA) 2 mg tablet, Take 2 mg by mouth at bedtime daily., Disp: , Rfl:   ???  ELIQUIS 5 mg tablet, Take 5 mg by mouth twice daily., Disp: , Rfl:   ???  EPINEPHrine (EPIPEN) 1 mg/mL injection pen (2-Pack), Inject 0.3 mg (1 Pen) into thigh if needed for anaphylactic reaction. May repeat in 5-15 minutes if needed.  Indications: a significant type of allergic reaction called anaphylaxis, Disp: 2 each, Rfl: 0  ???  fentaNYL citrate PF (SUBLIMAZE) 50 mcg/mL 50 mcg/mL in 20 mL intrathecal infusion syr, by Intrathecal route Continuous. Please call [facility] (Dr. Janyth Contes and [facility]) for settings., Disp: , Rfl:   ???  fluticasone propionate (FLONASE) 50 mcg/actuation nasal spray, suspension, Apply 50 sprays into nose as directed as Needed., Disp: , Rfl:   ???  fluticasone/salmeterol (ADVAIR DISKUS) 100-50 mcg inhalation disk,

## 2019-03-16 DIAGNOSIS — G894 Chronic pain syndrome: ICD-10-CM

## 2019-03-16 DIAGNOSIS — Z9889 Other specified postprocedural states: Secondary | ICD-10-CM

## 2019-03-23 ENCOUNTER — Encounter: Admit: 2019-03-23 | Discharge: 2019-03-23 | Payer: MEDICARE

## 2019-03-26 ENCOUNTER — Ambulatory Visit: Admit: 2019-03-26 | Discharge: 2019-03-27 | Payer: MEDICARE

## 2019-03-26 ENCOUNTER — Encounter: Admit: 2019-03-26 | Discharge: 2019-03-26 | Payer: MEDICARE

## 2019-03-26 DIAGNOSIS — Z451 Encounter for adjustment and management of infusion pump: Secondary | ICD-10-CM

## 2019-03-26 DIAGNOSIS — I10 Essential (primary) hypertension: Principal | ICD-10-CM

## 2019-03-26 DIAGNOSIS — I499 Cardiac arrhythmia, unspecified: ICD-10-CM

## 2019-03-26 MED ORDER — FENTANYL 50MCG/ML IT PAIN PUMP SYR
Freq: Once | INTRATHECAL | 0 refills | Status: CP
Start: 2019-03-26 — End: ?
  Administered 2019-03-26: 16:00:00 20.000 mL via INTRATHECAL

## 2019-03-27 ENCOUNTER — Encounter: Admit: 2019-03-27 | Discharge: 2019-03-27 | Payer: MEDICARE

## 2019-03-27 DIAGNOSIS — G894 Chronic pain syndrome: Principal | ICD-10-CM

## 2019-03-27 DIAGNOSIS — I10 Essential (primary) hypertension: Principal | ICD-10-CM

## 2019-03-27 DIAGNOSIS — M47817 Spondylosis without myelopathy or radiculopathy, lumbosacral region: ICD-10-CM

## 2019-03-27 DIAGNOSIS — I499 Cardiac arrhythmia, unspecified: ICD-10-CM

## 2019-03-27 NOTE — Procedures
Intrathecal Pump Inquiry & Refill     Initial pump interrogation information:   Drug/Concentration: Fentanyl 50 mcg/ml in sodium chloride PF 0.9%  Infusion Mode/Rate: Periodic Flow   Bolus at 7.5 mcg per dose over 7 minutes every 6 hours, 4 max per 24 hours   Total Maximum Daily Dose: 30 mcg/day  Reservoir Volume: 4.997 mL       The pump site was prepped with chloroprep and draped in a sterile manner. The refill template was placed over the pump and the refill port was located properly. A 22-gauge Huber needle with tubing (clamped) was advanced into the refill port. A 20 mL syringe was attached and aspiration was steady until 5.5 mL withdrawn and bubbles were noted. (Medication wasted with Cherie Adair,RN) The tubing was re-clamped and a new syringe containing 20mL of Fentanyl 50 mcg/ml in sodium chloride PF 0.9% was attached. The tubing was unclamped and the pump was filled easily without overpressure. The needle was removed; the area was cleaned, and a bandage was placed over the needle insertion site.       After reprogramming, the following settings were noted:   Drug/Concentration: Fentanyl 50 mcg/ml in sodium chloride PF 0.9%  Infusion Mode/Rate: Periodic Flow  Bolus at 7.5 mcg per dose over 7 minutes every 6 hours, 4 max per 24 hours   Total Maximum Daily Dose: 30 mcg/day  Reservoir Volume: 19.3 mL   Low Reservoir Alarm Date: Apr 22, 2019      Disposition: The patient tolerated the procedure well, and there were no apparent complications.     Estimated Blood Loss: none    Complications: none

## 2019-03-29 ENCOUNTER — Encounter: Admit: 2019-03-29 | Discharge: 2019-03-29 | Payer: MEDICARE

## 2019-03-29 NOTE — Telephone Encounter
RN received call from [facility] at [facility] wanting to know if it was ok to cancel request to fill patients pain pump in home since he was filled on 03/26/2019 in person at spine center. RN confirmed patient was scheduled in May for in person refill as well.   [facility] will cancel referral at this time.

## 2019-04-12 ENCOUNTER — Encounter: Admit: 2019-04-12 | Discharge: 2019-04-12 | Payer: MEDICARE

## 2019-04-12 ENCOUNTER — Ambulatory Visit: Admit: 2019-04-12 | Discharge: 2019-04-12 | Payer: MEDICARE

## 2019-04-12 DIAGNOSIS — M47817 Spondylosis without myelopathy or radiculopathy, lumbosacral region: Principal | ICD-10-CM

## 2019-04-12 MED ORDER — BUPIVACAINE (PF) 0.5 % (5 MG/ML) IJ SOLN
3 mL | Freq: Once | INTRAMUSCULAR | 0 refills | Status: CP
Start: 2019-04-12 — End: ?

## 2019-04-12 MED ORDER — LIDOCAINE (PF) 10 MG/ML (1 %) IJ SOLN
2 mL | Freq: Once | INTRAMUSCULAR | 0 refills | Status: CP
Start: 2019-04-12 — End: ?

## 2019-04-12 NOTE — Progress Notes
[facility]INE [facility]ENTER  INTERVENTIONAL PAIN PROCEDURE HISTORY AND PHYSICAL    No chief complaint on file.      HISTORY OF PRESENT ILLNESS:    Axial lower back pain  Pain is sharp, stabbing, and severe  VAS 8/10    Medical History:   Diagnosis Date   ??? Arrhythmia    ??? Essential hypertension        Surgical History:   Procedure Laterality Date   ??? REVISION  SPINAL NEUROSTIMULATOR PULSE GENERATOR/ RECEIVER N/A 07/25/2017    Performed by [name], MD at [facility] OR   ??? IMPLANTATION INTRATHECAL PUMP IMPLANT N/A 10/17/2017    Performed by [name], MD at [facility] OR   ??? BACK SURGERY     ??? HERNIA REPAIR     ??? HX JOINT REPLACEMENT      total right, uni left   ??? KNEE SURGERY     ??? NECK SURGERY     ??? PENIS SURGERY     ??? PROSTATECTOMY     ??? STIMULATOR IMPLANT         family history is not on file.    Social History     Socioeconomic History   ??? Marital status: Married     Spouse name: Not on file   ??? Number of children: Not on file   ??? Years of education: Not on file   ??? Highest education level: Not on file   Occupational History   ??? Not on file   Tobacco Use   ??? Smoking status: Never Smoker   ??? Smokeless tobacco: Never Used   Substance and Sexual Activity   ??? Alcohol use: Not on file   ??? Drug use: Not on file   ??? Sexual activity: Not on file   Other Topics Concern   ??? Not on file   Social History Narrative   ??? Not on file       No Known Allergies    Vitals:    04/12/19 1203   BP: 132/72   Pulse: 52   Temp: 36.4 ???C (97.5 ???F)   SpO2: 100%   Weight: 91.5 kg (201 lb 12.8 oz)   Height: 180.3 cm (71)       REVIEW OF SYSTEMS: 10 point ROS obtained and negative except per HPI      PHYSICAL EXAM:  Gen: Alert x 3  Chest: CTAB  Neck:Supple  Psych: Normal mood and affect  Skin: no rashes or lesions  Neuro: Grossly intact  Musc:     Patient has tenderness to palpation in the lumbar facets at L3-L5. This pain is exacerbated with extension and lateral flexion bilaterally          IMPRESSION:

## 2019-04-12 NOTE — Procedures
Injection procedure: Incremental injection and Negative aspiration for blood  Patient tolerance: Patient tolerated the procedure well with no immediate complications. Pressure was applied, and hemostasis was accomplished.  Outcome: Pain improved  Comments: 0.5ml of 0.5% bupivicaine injected at each level        Estimated blood loss: none or minimal  Specimens: none  Patient tolerated the procedure well with no immediate complications. Pressure was applied, and hemostasis was accomplished.

## 2019-04-16 ENCOUNTER — Encounter: Admit: 2019-04-16 | Discharge: 2019-04-16 | Payer: MEDICARE

## 2019-04-16 ENCOUNTER — Ambulatory Visit: Admit: 2019-04-16 | Discharge: 2019-04-17 | Payer: MEDICARE

## 2019-04-16 DIAGNOSIS — I499 Cardiac arrhythmia, unspecified: ICD-10-CM

## 2019-04-16 DIAGNOSIS — I10 Essential (primary) hypertension: Principal | ICD-10-CM

## 2019-04-16 MED ORDER — FENTANYL 50MCG/ML IT PAIN PUMP SYR
Freq: Once | INTRATHECAL | 0 refills | Status: CP
Start: 2019-04-16 — End: ?
  Administered 2019-04-16: 19:00:00 20.000 mL via INTRATHECAL

## 2019-04-16 NOTE — Procedures
Intrathecal Pump Inquiry & Refill     Initial pump interrogation information:   Drug/Concentration: Fentanyl 50 mcg/ml in sodium chloride PF 0.9%  Infusion Mode/Rate: Periodic Flow   Bolus at 7.5 mcg per dose over every 6 hours prn up to 4 max per 24 hours   Total Maximum Daily Dose: 30 mcg/day  Reservoir Volume: 6.624 mL       The pump site was prepped with chloroprep and draped in a sterile manner. The refill template was placed over the pump and the refill port was located properly. A 22-gauge Huber needle with tubing (clamped) was advanced into the refill port. A 20 mL syringe was attached and aspiration was steady until 7.1 mL withdrawn and bubbles were noted. (Medication wasted with Cherie Adair,RN) The tubing was re-clamped and a new syringe containing 20mL of Fentanyl 50 mcg/ml in sodium chloride PF 0.9% was attached. The tubing was unclamped and the pump was filled easily without overpressure. The needle was removed; the area was cleaned, and a bandage was placed over the needle insertion site.       After reprogramming, the following settings were noted:   Drug/Concentration: Fentanyl 50 mcg/ml in sodium chloride PF 0.9%  Infusion Mode/Rate: Periodic Flow  Bolus at 7.5 mcg per dose over 7 minutes every 6 hours prn up to 4 max per 24 hours.   Total Maximum Daily Dose: 30 mcg/day  Reservoir Volume: 19.3 mL   Low Reservoir Alarm Date: June 14th, 2020     Disposition: The patient tolerated the procedure well, and there were no apparent complications.     Estimated Blood Loss: none    Complications: none

## 2019-04-17 ENCOUNTER — Encounter: Admit: 2019-04-17 | Discharge: 2019-04-17 | Payer: MEDICARE

## 2019-04-17 DIAGNOSIS — Z451 Encounter for adjustment and management of infusion pump: Principal | ICD-10-CM

## 2019-04-17 DIAGNOSIS — M961 Postlaminectomy syndrome, not elsewhere classified: ICD-10-CM

## 2019-04-17 DIAGNOSIS — G894 Chronic pain syndrome: ICD-10-CM

## 2019-04-17 DIAGNOSIS — M47817 Spondylosis without myelopathy or radiculopathy, lumbosacral region: ICD-10-CM

## 2019-04-20 ENCOUNTER — Encounter: Admit: 2019-04-20 | Discharge: 2019-04-20 | Payer: MEDICARE

## 2019-04-20 DIAGNOSIS — M47817 Spondylosis without myelopathy or radiculopathy, lumbosacral region: Principal | ICD-10-CM

## 2019-04-24 ENCOUNTER — Ambulatory Visit: Admit: 2019-04-24 | Discharge: 2019-04-25 | Payer: MEDICARE

## 2019-04-24 ENCOUNTER — Encounter: Admit: 2019-04-24 | Discharge: 2019-04-24 | Payer: MEDICARE

## 2019-04-24 MED ORDER — BUPIVACAINE (PF) 0.5 % (5 MG/ML) IJ SOLN
6 mL | Freq: Once | INTRAMUSCULAR | 0 refills | Status: CP
Start: 2019-04-24 — End: ?
  Administered 2019-04-24: 16:00:00 6 mL via INTRAMUSCULAR

## 2019-04-24 NOTE — Procedures
Injection procedure: Incremental injection and Negative aspiration for blood  Patient tolerance: Patient tolerated the procedure well with no immediate complications. Pressure was applied, and hemostasis was accomplished.  Outcome: Pain improved  Comments: 0.5ml of 0.5% bupivicaine injected at each level        Estimated blood loss: none or minimal  Specimens: none  Patient tolerated the procedure well with no immediate complications. Pressure was applied, and hemostasis was accomplished.

## 2019-04-24 NOTE — Patient Instructions
Discharge Instructions for Medial Branch Block    Important information following your procedure today: You may drive today    This injection is for diagnostic purposes, it is a test. Only short term results are expected.    1. Though the procedure is generally safe and complications are rare, we do ask that you be aware of any of the following:   ? Any swelling, persistent redness, new bleeding, or drainage from the site of the injection.  ? You should not experience a severe headache.  ? You should not run a fever over 101??? F.  ? New onset of sharp, severe back & or neck pain.  ? New onset of upper or lower extremity numbness or weakness.  ? New difficulty controlling bowel or bladder function after the injection.  ? New shortness of breath.    If any of these occur, please call to report this occurrence to a nurse at [phone]. If you are calling after 4:00 p.m. or on weekends or holidays please call [phone] and ask to have the resident physician on call for the physician paged or go to your local emergency room.  2. You may experience soreness at the injection site. Ice can be applied at 20 minute intervals. Avoid application of direct heat, hot showers or hot tubs today.  3. Patients taking a daily blood thinner can resume their regular dose this evening.  4. It is important that you take all medications ordered by your pain physician. Taking medication as ordered is an important part of your pain care plan. If you cannot continue the medication plan, please notify the physician.   5. Remain active today. Do the activities that would normally cause you pain.  6. It is important for you to keep track of the results of this test on paper.  ? Did you get relief?  ? Percentage of relief?  ? How long did it last?  Call back to report the results to a nurse on Wednesday at [phone] . Use notes that you kept when giving your report. You may have to leave a

## 2019-04-25 ENCOUNTER — Ambulatory Visit: Admit: 2019-04-24 | Discharge: 2019-04-25 | Payer: MEDICARE

## 2019-04-25 DIAGNOSIS — I10 Essential (primary) hypertension: ICD-10-CM

## 2019-04-25 DIAGNOSIS — M47817 Spondylosis without myelopathy or radiculopathy, lumbosacral region: Principal | ICD-10-CM

## 2019-05-11 ENCOUNTER — Ambulatory Visit: Admit: 2019-05-11 | Discharge: 2019-05-12

## 2019-05-11 ENCOUNTER — Encounter: Admit: 2019-05-11 | Discharge: 2019-05-11

## 2019-05-11 DIAGNOSIS — Z451 Encounter for adjustment and management of infusion pump: Principal | ICD-10-CM

## 2019-05-11 DIAGNOSIS — I10 Essential (primary) hypertension: Secondary | ICD-10-CM

## 2019-05-11 DIAGNOSIS — I499 Cardiac arrhythmia, unspecified: Secondary | ICD-10-CM

## 2019-05-11 MED ORDER — FENTANYL 50MCG/ML IT PAIN PUMP SYR
Freq: Once | INTRATHECAL | 0 refills | Status: CP
Start: 2019-05-11 — End: ?
  Administered 2019-05-11: 18:00:00 20.000 mL via INTRATHECAL

## 2019-05-12 DIAGNOSIS — G894 Chronic pain syndrome: Secondary | ICD-10-CM

## 2019-05-12 DIAGNOSIS — M961 Postlaminectomy syndrome, not elsewhere classified: Secondary | ICD-10-CM

## 2019-05-15 ENCOUNTER — Encounter: Admit: 2019-05-15 | Discharge: 2019-05-15

## 2019-05-15 DIAGNOSIS — I10 Essential (primary) hypertension: Secondary | ICD-10-CM

## 2019-05-15 DIAGNOSIS — I499 Cardiac arrhythmia, unspecified: Secondary | ICD-10-CM

## 2019-05-16 NOTE — Procedures
Intrathecal Pump Inquiry & Refill     Initial pump interrogation information:   Drug/Concentration: Fentanyl 50 mcg/ml in sodium chloride PF 0.9%  Infusion Mode/Rate: Periodic Flow  Bolus at 7.5 mcg per dose over 7 minutes every 6 hours up to 4 max per 24 hours   Total Maximum Daily Dose: 30 mcg/day  Reservoir Volume: 4.384 mL       The pump site was prepped with chloroprep and draped in a sterile manner. The refill template was placed over the pump and the refill port was located properly. A 22-gauge Huber needle with tubing (clamped) was advanced into the refill port. A 20 mL syringe was attached and aspiration was steady until 5 mL withdrawn and bubbles were noted. (Medication wasted with Audie Box, APRN-NP) The tubing was re-clamped and a new syringe containing 33mL of Fentanyl 50 mcg/ml in sodium chloride PF 0.9% was attached. The tubing was unclamped and the pump was filled easily without overpressure. The needle was removed; the area was cleaned, and a bandage was placed over the needle insertion site.       After reprogramming, the following settings were noted:   Drug/Concentration: Fentanyl 50 mcg/ml in sodium chloride PF 0.9%   Infusion Mode/Rate: Periodic Flow  Bolus at 7.5 mcg per dose over 7 minutes every 6 hours up to 4 max per 24 hours.   Total Maximum Daily Dose: 30 mcg/day  Reservoir Volume: 19.3 mL   Low Reservoir Alarm Date: July 9th, 2020      Disposition: The patient tolerated the procedure well, and there were no apparent complications.     Estimated Blood Loss: none    Complications: none

## 2019-05-23 ENCOUNTER — Ambulatory Visit: Admit: 2019-05-23 | Discharge: 2019-05-23

## 2019-05-23 ENCOUNTER — Encounter: Admit: 2019-05-23 | Discharge: 2019-05-23

## 2019-05-23 DIAGNOSIS — R52 Pain, unspecified: Secondary | ICD-10-CM

## 2019-05-23 DIAGNOSIS — M47817 Spondylosis without myelopathy or radiculopathy, lumbosacral region: Principal | ICD-10-CM

## 2019-05-23 MED ORDER — LIDOCAINE (PF) 10 MG/ML (1 %) IJ SOLN
2 mL | Freq: Once | INTRAMUSCULAR | 0 refills | Status: CP
Start: 2019-05-23 — End: ?

## 2019-05-23 MED ORDER — BUPIVACAINE (PF) 0.5 % (5 MG/ML) IJ SOLN
10 mL | Freq: Once | INTRAMUSCULAR | 0 refills | Status: CP
Start: 2019-05-23 — End: ?

## 2019-05-23 MED ORDER — FENTANYL CITRATE (PF) 50 MCG/ML IJ SOLN
100 ug | INTRAVENOUS | 0 refills | Status: DC | PRN
Start: 2019-05-23 — End: 2019-11-29

## 2019-05-23 MED ORDER — MIDAZOLAM 1 MG/ML IJ SOLN
1-2 mg | INTRAVENOUS | 0 refills | Status: DC | PRN
Start: 2019-05-23 — End: 2019-11-29

## 2019-05-23 NOTE — Progress Notes
SPINE CENTER  INTERVENTIONAL PAIN PROCEDURE HISTORY AND PHYSICAL    No chief complaint on file.      HISTORY OF PRESENT ILLNESS:    Axial lower back pain  Pain is sharp, stabbing, and severe  VAS 8/10    Medical History:   Diagnosis Date   ??? Arrhythmia    ??? Essential hypertension        Surgical History:   Procedure Laterality Date   ??? REVISION  SPINAL NEUROSTIMULATOR PULSE GENERATOR/ RECEIVER N/A 07/25/2017    Performed by [name], MD at [facility] OR   ??? IMPLANTATION INTRATHECAL PUMP IMPLANT N/A 10/17/2017    Performed by [name], MD at [facility] OR   ??? BACK SURGERY     ??? HERNIA REPAIR     ??? HX JOINT REPLACEMENT      total right, uni left   ??? KNEE SURGERY     ??? NECK SURGERY     ??? PENIS SURGERY     ??? PROSTATECTOMY     ??? STIMULATOR IMPLANT         family history is not on file.    Social History     Socioeconomic History   ??? Marital status: Married     Spouse name: Not on file   ??? Number of children: Not on file   ??? Years of education: Not on file   ??? Highest education level: Not on file   Occupational History   ??? Not on file   Tobacco Use   ??? Smoking status: Never Smoker   ??? Smokeless tobacco: Never Used   Substance and Sexual Activity   ??? Alcohol use: Not on file   ??? Drug use: Not on file   ??? Sexual activity: Not on file   Other Topics Concern   ??? Not on file   Social History Narrative   ??? Not on file       No Known Allergies    Vitals:    05/23/19 1415   BP: (!) 155/103   BP Source: Arm, Right Upper   Pulse: 65   Temp: 36.8 ???C (98.2 ???F)   SpO2: 95%   Weight: 93 kg (205 lb)   Height: 177.8 cm (70)       REVIEW OF SYSTEMS: 10 point ROS obtained and negative except per HPI      PHYSICAL EXAM:  Gen: Alert x 3  Chest: CTAB  Neck:Supple  Psych: Normal mood and affect  Skin: no rashes or lesions  Neuro: Grossly intact  Musc:     Patient has tenderness to palpation in the lumbar facets at L3-L5. This pain is exacerbated with extension and lateral flexion bilaterally          IMPRESSION: 1. Spondylosis of lumbosacral region without myelopathy or radiculopathy         PLAN:   Bilateral L3-L5 RFA

## 2019-05-23 NOTE — Procedures
INTERVENTIONAL PAIN MANAGEMENT PROCEDURE REPORT    Radiofrequency Ablation (RFA) of Lumbar Medial Branch Nerves    Date of Service: 05/23/2019    Procedure Title(s):    1. Radiofrequency ablation of bilateral L3 - L5(S1) medial branch  nerves   2. Intraoperative fluoroscopy    Attending Surgeon: [name], MD    Pre-Procedure Diagnosis:   1. Spondylosis of lumbosacral region without myelopathy or radiculopathy        Post-Procedure Diagnosis:   1. Spondylosis of lumbosacral region without myelopathy or radiculopathy        Anesthesia: Local                       Anxiolysis Yes           Procedural Sedation Yes    Indications: Kristopher Richard is a 73 y.o. male with a diagnosis of spondylosis. The patient's history and physical exam were reviewed. The patient has failed conservative measures including physical therapy and medication management. On exam the patient exhibits significant tenderness in the above stated levels which is exacerbated by extension and lateral flexion to the painful sides. The patient has had Two medial branch blocks with greater than 75% reduction in pain for the duration of the local anesthetic. The risks, benefits and alternatives to the procedure were discussed, and all questions were answered to the patient's satisfaction. The patient agreed to proceed, and written informed consent was obtained.     Procedure in Detail: IV was started? Yes    The patient was brought into the procedure room and placed in the prone position on the fluoroscopy table. Standard monitors were placed, and vital signs were observed throughout the procedure. The area of the lumbar spine and upper buttocks were prepped with SureCleans and draped in a sterile manner. AP fluoroscopy with oblique tilt to the right was used to identify and mark the junction between the superior articular process and transverse process at the L3-L5 levels. The right sacral ala was identified and marked. The skin and subcutaneous tissues in these identified areas were anesthetized with 1% lidocaine. A 18-gauge, 3.5 inch, 10 mm active tip radiofrequency probe was advanced toward each of these points under fluoroscopic guidance. Once bone was contacted, negative aspiration was confirmed.  2mL of 1% lidocaine was injected prior to lesioning, which was performed for 90 seconds at 80 degrees centigrade.     The same procedure was then performed on the opposite side: Yes.    The probes were removed with a 1% lidocaine flush. The patient's back was cleaned, and bandages were placed at the needle insertion sites.    Disposition: The patient tolerated the procedure well, and there were no apparent complications. Vital signs remained stable throughout the procedure. The patient was taken to the recovery area where discharge instructions for the procedure were given.    Estimated Blood Loss: Minimal    Specimens: None    Complications: None

## 2019-05-23 NOTE — Discharge Instructions - Supplementary Instructions
RADIOFREQUENCY ABLATION   POST PROCEDURE INSTRUCTIONS    ? Go directly home and rest. DO NOT drive today.  ? If you have a dressing or bandage on, you may remove it in 12 hours. You may shower tomorrow.   ? Apply ice to the procedure site at 20 minute intervals frequently for the next 24 hours.  ? If you had an IV for your procedure and a lump or redness occurs at the site, you may apply a warm, moist compress for 10minutes four times daily for 2-3 days.  ? If you had sedation for your procedure:  ? The anesthetic may make you drowsy and slow to react for up to 12 hours.  ? For the next 12 hours do not consume alcohol, drive, operate machinery, sign legal documents, or work.  ? Rest at home today.  ? A responsible adult needs to stay with you today and overnight.  ? Start with clear liquids and advance as tolerated.  ? Resume all previous medication unless directed not to.  ? It is not uncommon to experience an increase in pain for several days after the procedure.  ? The beneficial effects from the radiofrequency procedure may take several weeks to be noticed.  ? There should be minimal drainage and no swelling or redness at the injection sites. You should not experience a severe headache. You should not run a fever over 101F. If any of these occur, call to report this to a nurse at [phone]. If you are calling after 4PM or on weekends/holidays, call [phone] and ask to have the on-call pain management resident paged or go to your local emergency room.  ? Follow up appointment as needed.   ? If you have questions for the surgery center, call [facility] at [phone].

## 2019-05-28 ENCOUNTER — Ambulatory Visit: Admit: 2019-05-28 | Discharge: 2019-05-29

## 2019-05-28 ENCOUNTER — Encounter: Admit: 2019-05-28 | Discharge: 2019-05-28

## 2019-05-28 DIAGNOSIS — I10 Essential (primary) hypertension: Secondary | ICD-10-CM

## 2019-05-28 DIAGNOSIS — I499 Cardiac arrhythmia, unspecified: Secondary | ICD-10-CM

## 2019-05-28 DIAGNOSIS — Z451 Encounter for adjustment and management of infusion pump: Principal | ICD-10-CM

## 2019-05-28 DIAGNOSIS — M47812 Spondylosis without myelopathy or radiculopathy, cervical region: Secondary | ICD-10-CM

## 2019-05-28 DIAGNOSIS — M47816 Spondylosis without myelopathy or radiculopathy, lumbar region: Secondary | ICD-10-CM

## 2019-05-28 MED ORDER — FENTANYL 50MCG/ML IT PAIN PUMP SYR
Freq: Once | INTRATHECAL | 0 refills | Status: CP
Start: 2019-05-28 — End: ?
  Administered 2019-05-28: 20:00:00 20.000 mL via INTRATHECAL

## 2019-05-28 NOTE — Progress Notes
SPINE CENTER CLINIC NOTE       SUBJECTIVE:   Chronic back pain   Mostly axial   Less pain in legs but Does have knee pain bil related to bad joints   Hx of neck pain and shoulder pain as well   Right hip pain   Has upcoming surgery 05/31/19    Has had percocet in past for pain management - mild itch   Was trialed in [employer] [facility]  Morphine ITP trial - significant itch but had moderate pain control.   Dilaudid ITP trial - significant sweats   Fentanyl ITP trial - mild itch     He eventually had the ITP placed by Dr Janyth Contes 10/17/17  Reports limited relief with this device so far.   Had prialt dose optimized to   He had limited pain control but started to have psych side effects, seeing people and things in his house   He was transitioned to Fentanyl   Max dose per day   Current regimen is 7.68mcg every 6 hours, 4 times in a periodic flow mode   He does not feel this is doing much good.     He also has a Nevro SCS for C and L spine pain - implanted 06/2017  c spine leads have migrated  Continues to use his thoracic leads for l spine pain  Reports about 25% relief from this device     Other Trx include RFA of L spine   Most recent injection 1 week ago   No adverse effects so far, tolerating well   C spine RFA in March       Review of Systems   Constitutional: Negative.    HENT: Negative.    Eyes: Negative.    Respiratory: Negative.    Cardiovascular: Negative.    Gastrointestinal: Negative.    Endocrine: Negative.    Genitourinary: Negative.    Musculoskeletal: Positive for back pain, neck pain and neck stiffness.   Skin: Negative.    Allergic/Immunologic: Negative.    Neurological: Positive for headaches.   Hematological: Negative.    Psychiatric/Behavioral: Negative.        Current Outpatient Medications:   ???  amLODIPine (NORVASC) 10 mg tablet, Take 5 mg by mouth daily., Disp: , Rfl:   ???  doxazosin (CARDURA) 2 mg tablet, Take 4 mg by mouth at bedtime daily., Disp: , Rfl: ???  ELIQUIS 5 mg tablet, Take 5 mg by mouth twice daily., Disp: , Rfl:   ???  EPINEPHrine (EPIPEN) 1 mg/mL injection pen (2-Pack), Inject 0.3 mg (1 Pen) into thigh if needed for anaphylactic reaction. May repeat in 5-15 minutes if needed.  Indications: a significant type of allergic reaction called anaphylaxis, Disp: 2 each, Rfl: 0  ???  fentaNYL citrate PF (SUBLIMAZE) 50 mcg/mL 50 mcg/mL in 20 mL intrathecal infusion syr, by Intrathecal route Continuous. Please call [facility] (Dr. Janyth Contes and [facility]) for settings., Disp: , Rfl:   ???  fluticasone propionate (FLONASE) 50 mcg/actuation nasal spray, suspension, Apply 50 sprays into nose as directed as Needed., Disp: , Rfl:   ???  hydroCHLOROthiazide (HYDRODIURIL) 25 mg tablet, Take 25 mg by mouth every morning., Disp: , Rfl:   ???  metoprolol tartrate (LOPRESSOR) 100 mg tablet, Take 100 mg by mouth twice daily., Disp: , Rfl:   ???  multivit-min-FA-lycopen-lutein (CENTRUM SILVER MEN) 300-600-300 mcg tab, Take 1 tablet by mouth daily., Disp: , Rfl:   ???  orphenadrine ER (  NORFLEX) 100 mg tablet, Take 100 mg by mouth twice daily., Disp: , Rfl:   ???  senna/docusate (SENOKOT-S) 8.6/50 mg tablet, Take 1 tablet by mouth twice daily., Disp: , Rfl:   ???  temazepam (RESTORIL) 15 mg capsule, Take 15 mg by mouth at bedtime as needed., Disp: , Rfl:   ???  valsartan (DIOVAN) 160 mg tablet, Take 160 mg by mouth daily., Disp: , Rfl:     Current Facility-Administered Medications:   ???  fentaNYL citrate PF (SUBLIMAZE) injection 100 mcg, 100 mcg, Intravenous, Q2 MIN PRN, [name], MD, 50 mcg at 05/23/19 1441  ???  midazolam (VERSED) injection 1-2 mg, 1-2 mg, Intravenous, Q2 MIN PRN, [name], MD, 2 mg at 05/23/19 1438  No Known Allergies  Physical Exam  Vitals:    05/28/19 1419   BP: (!) 172/72   Pulse: 55   SpO2: 100%   Weight: 92.1 kg (203 lb)   Height: 177.8 cm (70)   PainSc: Six        Pain Score: Six  Body mass index is 29.13 kg/m???.    CT and MRI L spine reviewed CT 2018       IMPRESSION:  1. Adjustment and management of infusion pump    2. 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

## 2019-05-29 DIAGNOSIS — G894 Chronic pain syndrome: Secondary | ICD-10-CM

## 2019-05-29 DIAGNOSIS — M961 Postlaminectomy syndrome, not elsewhere classified: Secondary | ICD-10-CM

## 2019-05-29 DIAGNOSIS — R52 Pain, unspecified: Secondary | ICD-10-CM

## 2019-06-21 ENCOUNTER — Encounter: Admit: 2019-06-21 | Discharge: 2019-06-21

## 2019-06-21 ENCOUNTER — Ambulatory Visit: Admit: 2019-06-21 | Discharge: 2019-06-22

## 2019-06-21 DIAGNOSIS — Z451 Encounter for adjustment and management of infusion pump: Secondary | ICD-10-CM

## 2019-06-21 DIAGNOSIS — I499 Cardiac arrhythmia, unspecified: Secondary | ICD-10-CM

## 2019-06-21 DIAGNOSIS — I10 Essential (primary) hypertension: Secondary | ICD-10-CM

## 2019-06-21 NOTE — Progress Notes
[facility] CLINIC NOTE       SUBJECTIVE:   Chronic back pain   Mostly axial   Less pain in legs but Does have knee pain bil related to bad joints   Hx of neck pain and shoulder pain as well   Recent surgery 05/31/19 for right hip - managing well post op  ???  Has had percocet in past for pain management - mild itch   Was trialed in [facility]  Morphine ITP trial - significant itch but had moderate pain control.   Dilaudid ITP trial - significant sweats   Fentanyl ITP trial - mild itch   ???  He eventually had the ITP placed by Dr Janyth Contes 10/17/17  Reports limited relief with this device so far.   Had prialt dose optimized to   He had limited pain control but started to have psych side effects, seeing people and things in his house   He was transitioned to Fentanyl   Max dose per day   Current regimen is 7.8mcg every 6 hours, 4 times in a periodic flow mode   He does not feel this is doing much good.   ???  He also has a Nevro SCS for C and L spine pain - implanted 06/2017  c spine leads have migrated  Continues to use his thoracic leads for l spine pain  Reports about 25% relief from this device   ???  Other Trx include RFA of L spine   Most recent injection 1 week ago   No adverse effects so far, tolerating well   C spine RFA in March         Review of Systems   Constitutional: Negative.    HENT: Positive for postnasal drip and tinnitus.    Eyes: Positive for redness and itching.   Respiratory: Positive for apnea.    Cardiovascular: Negative.    Gastrointestinal: Positive for abdominal pain.   Endocrine: Negative.    Genitourinary: Negative.    Musculoskeletal: Positive for arthralgias, back pain, joint swelling, myalgias, neck pain and neck stiffness.   Allergic/Immunologic: Negative.    Neurological: Negative.    Hematological: Negative.    Psychiatric/Behavioral: Positive for agitation and decreased concentration.       Current Outpatient Medications: ???  amLODIPine (NORVASC) 10 mg tablet, Take 5 mg by mouth daily., Disp: , Rfl:   ???  doxazosin (CARDURA) 2 mg tablet, Take 4 mg by mouth at bedtime daily., Disp: , Rfl:   ???  ELIQUIS 5 mg tablet, Take 5 mg by mouth twice daily., Disp: , Rfl:   ???  EPINEPHrine (EPIPEN) 1 mg/mL injection pen (2-Pack), Inject 0.3 mg (1 Pen) into thigh if needed for anaphylactic reaction. May repeat in 5-15 minutes if needed.  Indications: a significant type of allergic reaction called anaphylaxis, Disp: 2 each, Rfl: 0  ???  fentaNYL citrate PF (SUBLIMAZE) 50 mcg/mL 50 mcg/mL in 20 mL intrathecal infusion syr, by Intrathecal route Continuous. Please call [facility] (Dr[facility] and [facility]) for settings., Disp: , Rfl:   ???  fluticasone propionate (FLONASE) 50 mcg/actuation nasal spray, suspension, Apply 50 sprays into nose as directed as Needed., Disp: , Rfl:   ???  hydroCHLOROthiazide (HYDRODIURIL) 25 mg tablet, Take 25 mg by mouth every morning., Disp: , Rfl:   ???  metoprolol tartrate (LOPRESSOR) 100 mg tablet, Take 100 mg by mouth twice daily., Disp: , Rfl:   ???  multivit-min-FA-lycopen-lutein (CENTRUM SILVER MEN)  300-600-300 mcg tab, Take 1 tablet by mouth daily., Disp: , Rfl:   ???  orphenadrine ER (NORFLEX) 100 mg tablet, Take 100 mg by mouth twice daily., Disp: , Rfl:   ???  senna/docusate (SENOKOT-S) 8.6/50 mg tablet, Take 1 tablet by mouth twice daily., Disp: , Rfl:   ???  temazepam (RESTORIL) 15 mg capsule, Take 15 mg by mouth at bedtime as needed., Disp: , Rfl:   ???  valsartan (DIOVAN) 160 mg tablet, Take 160 mg by mouth daily., Disp: , Rfl:     Current Facility-Administered Medications:   ???  fentaNYL citrate PF (SUBLIMAZE) injection 100 mcg, 100 mcg, Intravenous, Q2 MIN PRN, [name], MD, 50 mcg at 05/23/19 1441  ???  midazolam (VERSED) injection 1-2 mg, 1-2 mg, Intravenous, Q2 MIN PRN, [name], MD, 2 mg at 05/23/19 1438  No Known Allergies  Physical Exam  Vitals:    06/21/19 1251   BP: (!) 148/65   Pulse: 56 SpO2: 100%   Weight: 92.5 kg (204 lb)   Height: 177.8 cm (70)   PainSc: Four        Pain Score: Four  Body mass index is 29.27 kg/m???.    General: Alert, oriented, mild distress  HEENT: normocephalic  NECK: TTP along c spine and paracerv muscles. Limited ROM  Resp: Non labored breathing, no distress  Cardio: Pedal pulses palpable bilaterally, mild lower extremity edema  MS: TTP in lumbar region of spine and paraspinal muscles.  NEURO: Cranial nerves II-XII intact. Motor strength adequate, sensory exams normal. Gait antalgic.   Behavior: Calm, cooperative, behavior and speech normal.           IMPRESSION:  1. Adjustment and management of infusion pump    2. Postlaminectomy syndrome, lumbar region      PLAN:    ITP increase  Changed daily consant to per day   Encouraged to stop PTC for now   Will get him back in on Tuesday next week for refill   Plan to change drug to Fent 282mcg/ml and add Bupivicaine 5mg .   Advised benadryl or antihistamine OTC if itch begins

## 2019-06-22 DIAGNOSIS — M961 Postlaminectomy syndrome, not elsewhere classified: Secondary | ICD-10-CM

## 2019-06-22 NOTE — Procedures
ANESTHESIA PROCEDURE REPORT    Intrathecal Pump Analysis & Reprogramming    Date of Service: 06/21/2019    Procedure Title(s):    1. Electronic analysis & reprogramming of infusion pump    Attending Surgeon: Blanchard Kelch Madiline Saffran, APRN-NP    Pre-Procedure Diagnosis:   1. Adjustment and management of infusion pump    2. Postlaminectomy syndrome, lumbar region        Post-Procedure Diagnosis:   1. Adjustment and management of infusion pump    2. Postlaminectomy syndrome, lumbar region        Indications: Kristopher Richard is a 73 y.o. male with above diagnosis. The patient is here today for reprogramming of an implanted intrathecal infusion pump. The patient's history and physical exam were reviewed. The risks, benefits and alternatives to the procedure were discussed, and all questions were answered to the patient's satisfaction.     Procedure in Detail: The patient was in a seated position in a chair. Interrogation was performed and revealed the following settings:    Drug/Concentration: Fentanyl 50 mcg Reservoir Capacity: 20 mL  Infusion Mode/Rate: Simple continuous at 0 mg per day  Bolus at 7.5 mcg per dose every 6 hours up to 4 max per 24 hours   Reservoir Volume: 9.5 mL    After reprogramming, the following settings were noted:    Infusion Mode/Rate: Simple continuous at 50 mcg per day  Bolus at 7.5 mcg per dose every 6 hours up to 4 max per 24 hours   Low Reservoir Alarm Date: 7/27     Disposition: The patient tolerated the procedure well, and there were no apparent complications.     Estimated Blood Loss: none    Complications: none

## 2019-06-26 ENCOUNTER — Encounter: Admit: 2019-06-26 | Discharge: 2019-06-26

## 2019-06-26 ENCOUNTER — Ambulatory Visit: Admit: 2019-06-26 | Discharge: 2019-06-27

## 2019-06-26 DIAGNOSIS — I10 Essential (primary) hypertension: Secondary | ICD-10-CM

## 2019-06-26 DIAGNOSIS — Z451 Encounter for adjustment and management of infusion pump: Principal | ICD-10-CM

## 2019-06-26 DIAGNOSIS — I499 Cardiac arrhythmia, unspecified: Secondary | ICD-10-CM

## 2019-06-26 DIAGNOSIS — M961 Postlaminectomy syndrome, not elsewhere classified: Secondary | ICD-10-CM

## 2019-06-26 MED ORDER — INTRATHECAL PAIN PUMP SYR- NON UKH PHARMACY
1 | Freq: Once | INTRATHECAL | 0 refills | Status: CP
Start: 2019-06-26 — End: ?
  Administered 2019-06-26: 20:00:00 1 via INTRATHECAL

## 2019-06-26 NOTE — Progress Notes
SPINE [facility]ENTER CLINIC NOTE     SUBJECTIVE:   Chronic back pain   Recent increase in ITP from daily to Daily   Has noticed mild itch, managed with OTC antihistamines   So far has noticed slight     Back pain Mostly axial   Less pain in legs???but???Does have knee pain bil???related to bad joints   Hx of neck pain and shoulder pain as well   Recent surgery 05/31/19 for right hip - managing well post op  ???  Has had percocet in past???for pain management???- mild itch???  Was trialed in [facility]  Morphine ITP trial - significant itch but had moderate pain control.   Dilaudid ITP trial - significant sweats   Fentanyl ITP trial - mild itch???  ???  He eventually had the ITP placed by Dr Janyth Contes 10/17/17  Reports limited relief with this device so far.   Had prialt dose optimized to   He had limited pain control but started to have psych side effects, seeing people and things in his house   He was transitioned to Fentanyl   Max dose per day   Current regimen is 7.62mcg every 6 hours, 4 times in a periodic flow mode   He does not feel this is doing much good.   ???  He also has a Nevro SCS for C and L spine pain - implanted 06/2017  c spine leads have migrated  Continues to use his thoracic leads for l spine pain  Reports about 25% relief from this device   ???  Other Trx include RFA of???L???spine   Most recent injection 1 week ago   No adverse effects so far, tolerating well   C spine RFA in March  ???         Review of Systems   Constitutional: Negative.    HENT: Negative.    Eyes: Negative.    Respiratory: Negative.    Cardiovascular: Negative.    Gastrointestinal: Negative.    Endocrine: Negative.    Genitourinary: Negative.    Musculoskeletal: Positive for arthralgias, back pain, joint swelling and myalgias.   Skin: Negative.    Allergic/Immunologic: Negative.    Neurological: Negative.    Hematological: Bruises/bleeds easily.   Psychiatric/Behavioral: Negative.        Current Outpatient Medications: ???  amLODIPine (NORVASC) 10 mg tablet, Take 5 mg by mouth daily., Disp: , Rfl:   ???  doxazosin (CARDURA) 2 mg tablet, Take 4 mg by mouth at bedtime daily., Disp: , Rfl:   ???  ELIQUIS 5 mg tablet, Take 5 mg by mouth twice daily., Disp: , Rfl:   ???  EPINEPHrine (EPIPEN) 1 mg/mL injection pen (2-Pack), Inject 0.3 mg (1 Pen) into thigh if needed for anaphylactic reaction. May repeat in 5-15 minutes if needed.  Indications: a significant type of allergic reaction called anaphylaxis, Disp: 2 each, Rfl: 0  ???  fentaNYL citrate PF (SUBLIMAZE) 50 mcg/mL 50 mcg/mL in 20 mL intrathecal infusion syr, by Intrathecal route Continuous. Please call [facility] (Dr[facility] Jany[facility] Contes and [facility]) for settings., Disp: , Rfl:   ???  fluticasone propionate (FLONASE) 50 mcg/actuation nasal spray, suspension, Apply 50 sprays into nose as directed as Needed., Disp: , Rfl:   ???  hydroCHLOROthiazide (HYDRODIURIL) 25 mg tablet, Take 25 mg by mouth every morning., Disp: , Rfl:   ???  metoprolol tartrate (LOPRESSOR) 100 mg tablet, Take 100 mg by mouth twice daily., Disp: ,  Rfl:   ???  multivit-min-FA-lycopen-lutein (CENTRUM SILVER MEN) 300-600-300 mcg tab, Take 1 tablet by mouth daily., Disp: , Rfl:   ???  orphenadrine ER (NORFLEX) 100 mg tablet, Take 100 mg by mouth twice daily., Disp: , Rfl:   ???  senna/docusate (SENOKOT-S) 8.6/50 mg tablet, Take 1 tablet by mouth twice daily., Disp: , Rfl:   ???  temazepam (RESTORIL) 15 mg capsule, Take 15 mg by mouth at bedtime as needed., Disp: , Rfl:   ???  valsartan (DIOVAN) 160 mg tablet, Take 160 mg by mouth daily., Disp: , Rfl:     Current Facility-Administered Medications:   ???  fentaNYL citrate PF (SUBLIMAZE) injection 100 mcg, 100 mcg, Intravenous, Q2 MIN PRN, [name], MD, 50 mcg at 05/23/19 1441  ???  midazolam (VERSED) injection 1-2 mg, 1-2 mg, Intravenous, Q2 MIN PRN, [name], MD, 2 mg at 05/23/19 1438  No Known Allergies  Physical Exam  Vitals:    06/26/19 1420   Weight: 92.5 kg (204 lb) Height: 177.8 cm (70)   PainSc: Six        Pain Score: Six  Body mass index is 29.27 kg/m???.  General: Alert, oriented, mild distress  HEENT: normocephalic  Resp: Non labored breathing, no distress  Cardio: Pedal pulses palpable bilaterally, no lower extremity edema  MS: TTP in lumbar region of spine and paraspinal muscles.  NEURO: Cranial nerves II-XII intact. Motor strength & sensory exams unchanged.  Gait coordinated.   Behavior: Calm, cooperative, behavior and speech normal.             IMPRESSION:  1. Adjustment and management of infusion pump    2. Postlaminectomy syndrome, lumbar region          PLAN:    Refill itp with added bupiviaine   Increase dose slightly   Cont OTC antihistamines for itch

## 2019-06-26 NOTE — Procedures
INTERVENTIONAL PAIN MANAGEMENT PROCEDURE REPORT    Intrathecal Pump Refill & Reprogramming    Date of Service: 06/26/2019    Procedure Title(s):    1. Electronic analysis, reprogramming & refill of infusion pump   2. Refill & maintenance of infusion pump    Attending Surgeon: Thayer Dallas Rafeef Lau, APRN-NP     Pre-Procedure Diagnosis:   1. Adjustment and management of infusion pump    2. Postlaminectomy syndrome, lumbar region        Post-Procedure Diagnosis:   1. Adjustment and management of infusion pump    2. Postlaminectomy syndrome, lumbar region        Indications: Kristopher Richard is a 73 y.o. male with above diagnosis. The patient is here today for refill and reprogramming of an implanted intrathecal infusion pump. The patient's history and physical exam were reviewed. The risks, benefits and alternatives to the procedure were discussed, and all questions were answered to the patient's satisfaction. The patient agreed to proceed, and written informed consent was obtained.     Procedure in Detail: The patient was brought into the exam room and was in a supine position on the exam table. Interrogation was performed and revealed the following settings:    Drug/Concentration: Fentanyl 50 mcg  Infusion Mode/Rate: Simple continuous at 50 mcg per day  Bolus at 7.5 mcg per dose every 4 hours up to 4 max per 24 hours   Reservoir Volume: 4 mL    The pump site was then prepped with chloroprep and draped in a sterile manner. The refill template was placed over the pump and the refill port was located. A 22-gauge Huber needle with tubing (clamped) was advanced into the refill port. A 20 mL syringe was attached and aspiration was steady until 6 mL were withdrawn and bubbles were noted. The tubing was re-clamped and a new syringe containing 20 mL of Bupivacaine 5 mg/mL  and Fentanyl 200 mcg was attached. The tubing was unclamped and the pump was filled easily without overpressure. The needle was removed; the area was cleaned, and a bandage was placed over the needle insertion site.    After reprogramming, the following settings were noted:    Drug/Concentration: Bupivacaine 5 mg/mL  and Fentanyl 200 mcg  Infusion Mode/Rate: Simple continuous at 75 mcg per day  Bolus disabled   Bridge Bolus ~ 7 hours  Reservoir Volume: 19.3 mL  Low Reservoir Alarm Date: 08/07/19    Disposition: The patient tolerated the procedure well, and there were no apparent complications.     Estimated Blood Loss: none    Complications: none

## 2019-06-28 ENCOUNTER — Encounter: Admit: 2019-06-28 | Discharge: 2019-06-28

## 2019-06-28 NOTE — Telephone Encounter
Kristopher Richard called and left a message stating that after his pump refill, around yesterday 4:30am started to itch pretty badly. Taking Claritin and Benadryl are helping some but not completely. He thinks he can tolerate it but just wanted to give an update.     [name] informed, would continue current plan and to follow up on Monday if the same or worse for a possible drug change.     Spoke with [place] on the phone  Reports that he is itchy and it started around 4:30am yesterday  Taking Benadryl and Claritin   As the day wore on he wanted constantly itch but tried to keep busy with work around the house   He voiced that he is wanting to wait it out and see if he adjusts to this   Informed him to give [place] an update on Monday or Tuesday with how he did over the weekend    No questions or concerns

## 2019-07-05 ENCOUNTER — Encounter: Admit: 2019-07-05 | Discharge: 2019-07-05

## 2019-07-05 NOTE — Telephone Encounter
Reviewed with [name], if patient wants an adjustment, wouldn't do this for about 1 more week to see if he needs a longer time adjusting to the medication.     Spoke with [place] on the phone  Reports that the itchiness is very manageable if not gone  Does not want to come in sooner, will keep current appointment in Sept to adjust then if needed.   No questions or concerns at this time

## 2019-07-05 NOTE — Telephone Encounter
Kristopher Richard called and left a message with an update regarding his pain pump settings. Stated that pain wise, not much different, pain is still kind of intense. Itchiness has started to subside and he is not having to rely on the Benadryl or Clartin as much if at all.

## 2019-08-02 ENCOUNTER — Encounter: Admit: 2019-08-02 | Discharge: 2019-08-02

## 2019-08-02 DIAGNOSIS — Z451 Encounter for adjustment and management of infusion pump: Secondary | ICD-10-CM

## 2019-08-02 DIAGNOSIS — R52 Pain, unspecified: Secondary | ICD-10-CM

## 2019-08-02 DIAGNOSIS — G894 Chronic pain syndrome: Secondary | ICD-10-CM

## 2019-08-02 DIAGNOSIS — M47816 Spondylosis without myelopathy or radiculopathy, lumbar region: Secondary | ICD-10-CM

## 2019-08-02 DIAGNOSIS — M961 Postlaminectomy syndrome, not elsewhere classified: Secondary | ICD-10-CM

## 2019-08-02 IMAGING — CR PELVIS
3 series · 3 of 3 positions shown · non-contrast
Comparison: none

[pelvis]
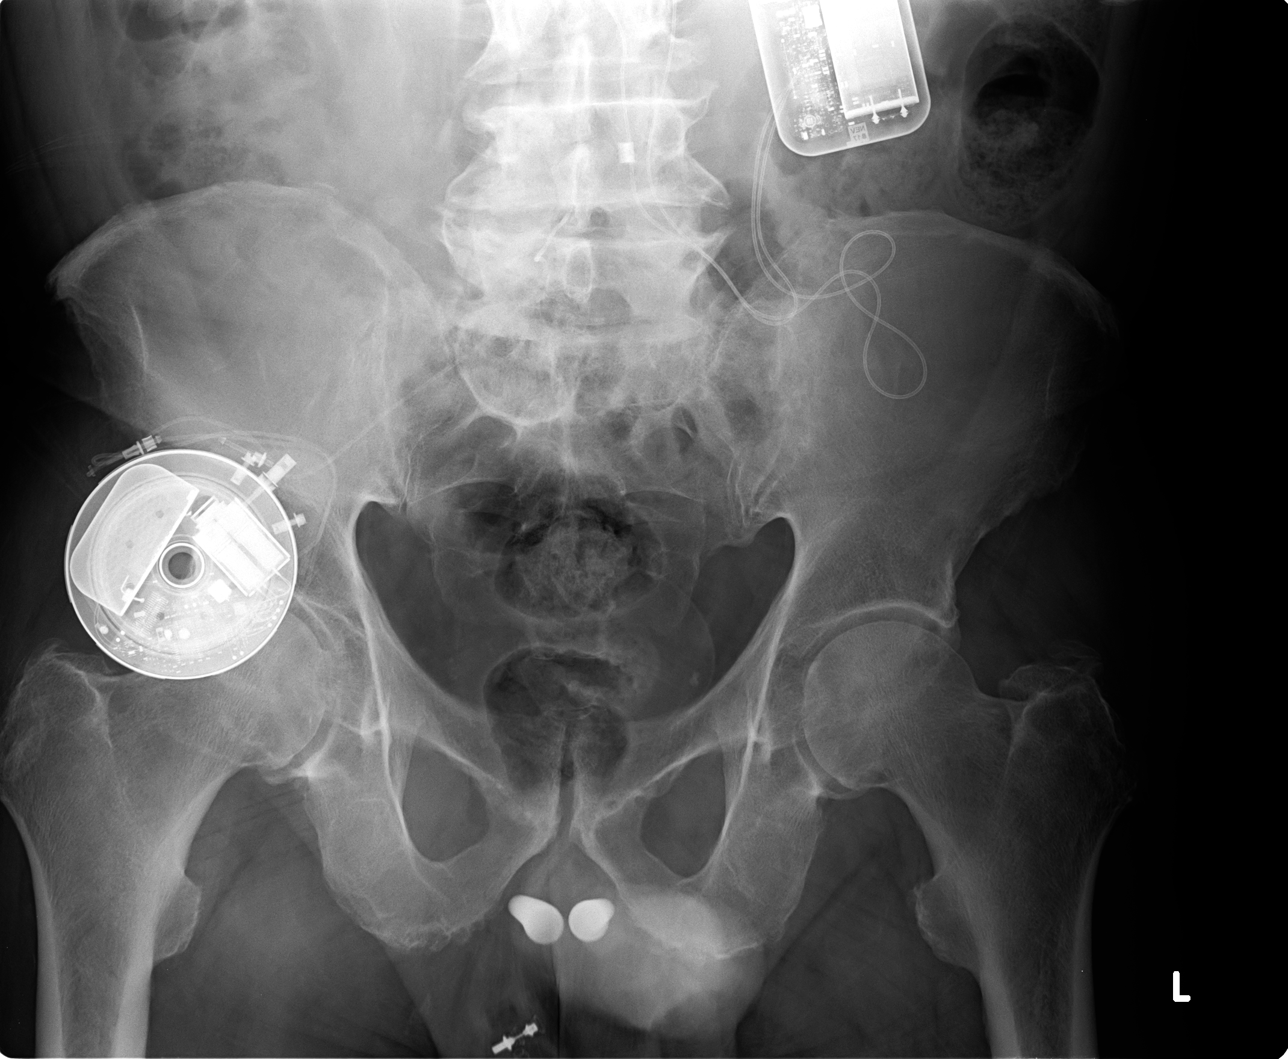

[hip ap]
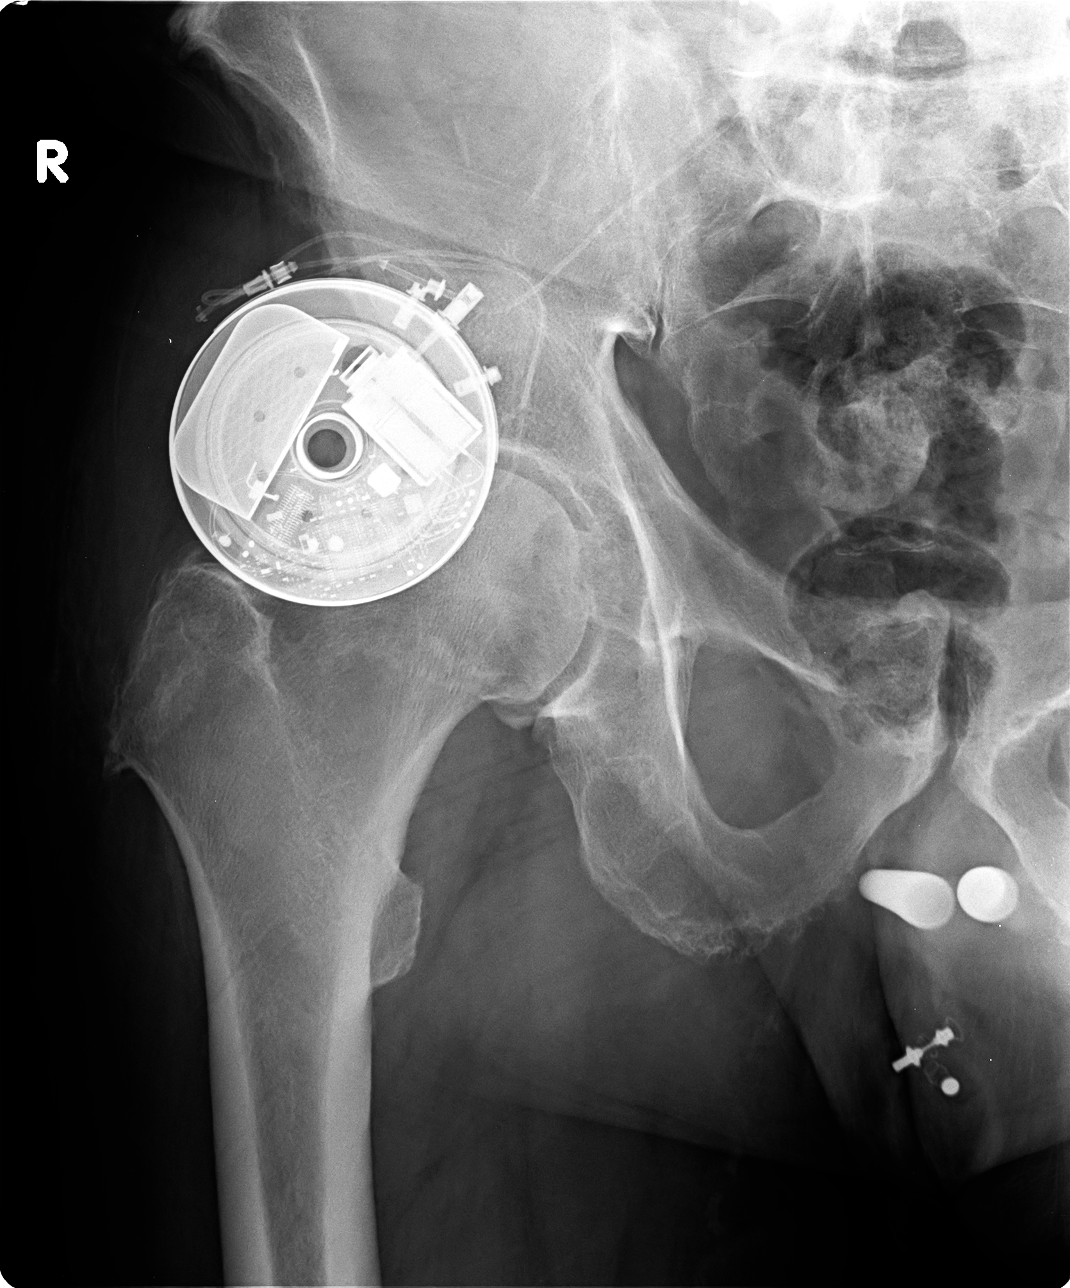

[hip frog]
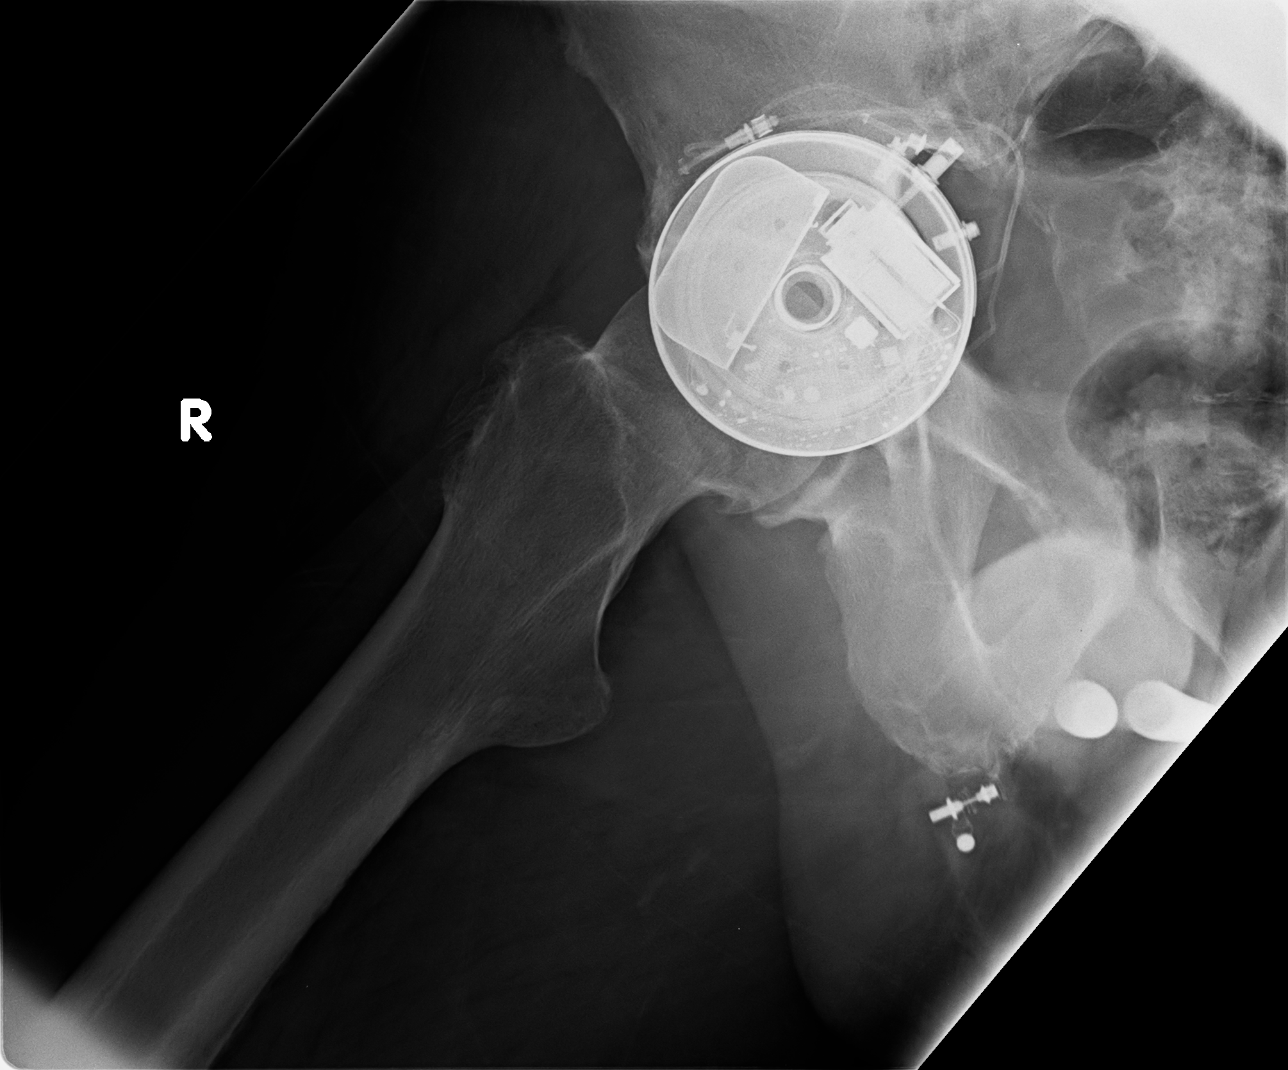

[3 of 3 positions shown; findings below may reference images not displayed]

DIAGNOSTIC STUDIES

EXAM
[Two views right

INDICATION
right hip pain
pt. c/o rt. hip pain.

TECHNIQUE
Single view pelvis with AP and Frogleg views right Hip

COMPARISONS
None available.

FINDINGS
The femoral heads are well seated in the acetabula. There is no displaced fracture or dislocation.
There is axial loss of joint space with marginal osteophyte formation. The superolateral femoral
head is limited in evaluation due to overlying pump battery housing. The soft tissues are grossly
unremarkable. The partially visualized bowel gas is within normal limits.

IMPRESSION
1.Likely mild-to-moderate degenerative changes of the hip. Partially limited exam due to
nonvisualization of the superolateral aspect of the femoral head secondary to overlying pump
apparatus..

Tech Notes:

pt. c/o rt. hip pain.

## 2019-08-03 ENCOUNTER — Encounter: Admit: 2019-08-03 | Discharge: 2019-08-03

## 2019-08-03 DIAGNOSIS — I10 Essential (primary) hypertension: Secondary | ICD-10-CM

## 2019-08-03 DIAGNOSIS — I499 Cardiac arrhythmia, unspecified: Secondary | ICD-10-CM

## 2019-08-03 DIAGNOSIS — M961 Postlaminectomy syndrome, not elsewhere classified: Secondary | ICD-10-CM

## 2019-08-03 MED ORDER — INTRATHECAL PAIN PUMP SYR- NON UKH PHARMACY
1 | Freq: Once | INTRATHECAL | 0 refills | Status: CP
Start: 2019-08-03 — End: ?
  Administered 2019-08-03: 16:00:00 1 via INTRATHECAL

## 2019-08-03 NOTE — Progress Notes
SPINE [facility]ENTER CLINIC NOTE       SUBJECTIVE:   Chronic back pain   Recent increase in ITP from daily to Daily   Had noticed mild itch, managed with OTC antihistamines early on. This has mostly resolved and tolerates without complication  Does not feel significant change or improvement in pain overall   Cont to use SCS and is on program 1   Has not made a lot of adjustments with the stim since last visit   Does notice that this does help because when its off, he can tell the pain is worse. Did have an episode of overstimulation as well   Comes in for ITP refill   ???  Back pain Mostly axial   Less pain in legs???but???Does have knee pain bil???related to bad joints   Hx of neck pain and shoulder pain as well???  Recent???surgery 05/31/19???for right hip - managing well post op  ???  Has had percocet in past???for pain management???- mild itch???  Was trialed in [facility]  Morphine ITP trial - significant itch but had moderate pain control.   Dilaudid ITP trial - significant sweats   Fentanyl ITP trial - mild itch???  ???  He eventually had the ITP placed by Dr Janyth Contes 10/17/17  Reports limited relief with this device so far.   Had prialt dose optimized to   He had limited pain control but started to have psych side effects, seeing people and things in his house   ???  He also has a Nevro SCS for C and L spine pain - implanted 06/2017  c spine leads have migrated  Continues to use his thoracic leads for l spine pain  Reports about 25% relief from this device   ???  Other Trx include RFA of???L???spine   Most recent injection 1 week ago   No adverse effects so far, tolerating well   C spine RFA in March         Review of Systems   Constitutional: Positive for fatigue.   HENT: Positive for sinus pressure and tinnitus.    Eyes: Positive for itching.   Respiratory: Positive for apnea and cough.    Cardiovascular: Negative.    Gastrointestinal: Positive for abdominal pain.   Endocrine: Negative.    Genitourinary: Positive for frequency. Musculoskeletal: Positive for arthralgias, back pain, myalgias, neck pain and neck stiffness.   Allergic/Immunologic: Negative.    Neurological: Negative.    Hematological: Negative.    Psychiatric/Behavioral: Positive for agitation, decreased concentration and sleep disturbance.       Current Outpatient Medications:   ???  amLODIPine (NORVASC) 10 mg tablet, Take 5 mg by mouth daily., Disp: , Rfl:   ???  doxazosin (CARDURA) 2 mg tablet, Take 4 mg by mouth at bedtime daily., Disp: , Rfl:   ???  ELIQUIS 5 mg tablet, Take 5 mg by mouth twice daily., Disp: , Rfl:   ???  EPINEPHrine (EPIPEN) 1 mg/mL injection pen (2-Pack), Inject 0.3 mg (1 Pen) into thigh if needed for anaphylactic reaction. May repeat in 5-15 minutes if needed.  Indications: a significant type of allergic reaction called anaphylaxis, Disp: 2 each, Rfl: 0  ???  fentaNYL citrate PF (SUBLIMAZE) 50 mcg/mL 50 mcg/mL in 20 mL intrathecal infusion syr, by Intrathecal route Continuous. Please call [facility] (Dr[facility] Jany[facility] Contes and [facility]) for settings., Disp: , Rfl:   ???  fluticasone propionate (FLONASE) 50 mcg/actuation nasal spray, suspension, Apply 50 sprays  into nose as directed as Needed., Disp: , Rfl:   ???  hydroCHLOROthiazide (HYDRODIURIL) 25 mg tablet, Take 25 mg by mouth every morning., Disp: , Rfl:   ???  metoprolol tartrate (LOPRESSOR) 100 mg tablet, Take 100 mg by mouth twice daily., Disp: , Rfl:   ???  multivit-min-FA-lycopen-lutein (CENTRUM SILVER MEN) 300-600-300 mcg tab, Take 1 tablet by mouth daily., Disp: , Rfl:   ???  orphenadrine ER (NORFLEX) 100 mg tablet, Take 100 mg by mouth twice daily., Disp: , Rfl:   ???  senna/docusate (SENOKOT-S) 8.6/50 mg tablet, Take 1 tablet by mouth twice daily., Disp: , Rfl:   ???  temazepam (RESTORIL) 15 mg capsule, Take 15 mg by mouth at bedtime as needed., Disp: , Rfl:   ???  valsartan (DIOVAN) 160 mg tablet, Take 160 mg by mouth daily., Disp: , Rfl:     Current Facility-Administered Medications: ???  fentaNYL citrate PF (SUBLIMAZE) injection 100 mcg, 100 mcg, Intravenous, Q2 MIN PRN, [name], MD, 50 mcg at 05/23/19 1441  ???  midazolam (VERSED) injection 1-2 mg, 1-2 mg, Intravenous, Q2 MIN PRN, [name], MD, 2 mg at 05/23/19 1438  No Known Allergies  Physical Exam  Vitals:    08/03/19 1100   BP: 139/76   Pulse: 54   SpO2: 100%   Weight: 93 kg (205 lb)   Height: 177.8 cm (70)   PainSc: Eight        Pain Score: Eight  Body mass index is 29.41 kg/m???.    General: Alert, oriented, mild distress  HEENT: normocephalic  Resp: Non labored breathing, no distress  Cardio: Pedal pulses palpable bilaterally, no lower extremity edema  MS: TTP in lumbar region of spine and paraspinal muscles.  NEURO: Cranial nerves II-XII intact. Motor strength 5+ throughout, sensory exams normal. Gait coordinated.   Behavior: Calm, cooperative, behavior and speech normal.           IMPRESSION:  1. Adjustment and management of infusion pump    2. 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 Chronic pain syndrome          PLAN:    Refill ITP   Dose increase to per day   Discussed goals of therapy   So far no pain control   Not sure we will continue to increase if he has no relief   May suggest change in med therapy in future if needed   Consider f.u call in 2 weeks to eval changes   Next refill in 1 mo

## 2019-08-03 NOTE — Procedures
INTERVENTIONAL PAIN MANAGEMENT PROCEDURE REPORT    Intrathecal Pump Refill & Reprogramming    Date of Service: 08/03/2019    Procedure Title(s):    1. Electronic analysis, reprogramming & refill of infusion pump   2. Refill & maintenance of infusion pump    Attending Surgeon: Thayer Dallas Jami Ohlin, APRN-NP     Pre-Procedure Diagnosis:   1. Adjustment and management of infusion pump    2. 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 Chronic pain syndrome        Post-Procedure Diagnosis:   1. Adjustment and management of infusion pump    2. 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 Chronic pain syndrome        Indications: Kristopher Richard is a 73 y.o. male with above diagnosis. The patient is here today for refill and reprogramming of an implanted intrathecal infusion pump. The patient's history and physical exam were reviewed. The risks, benefits and alternatives to the procedure were discussed, and all questions were answered to the patient's satisfaction. The patient agreed to proceed, and written informed consent was obtained.     Procedure in Detail: The patient was brought into the exam room and was in a supine position on the exam table. Interrogation was performed and revealed the following settings:    Drug/Concentration: Bupivacaine 5 mg/mL  and Fentanyl 200 mcg  Infusion Mode/Rate: Simple continuous at 75 mcg per day  Reservoir Volume: 4 mL    The pump site was then prepped with chloroprep and draped in a sterile manner. The refill template was placed over the pump and the refill port was located. A 22-gauge Huber needle with tubing (clamped) was advanced into the refill port. A 20 mL syringe was attached and aspiration was steady until 5.5 mL were withdrawn and bubbles were noted. The tubing was re-clamped and a new syringe containing 20 mL of Bupivacaine 5 mg/mL  and Fentanyl 200 mcg was attached. The tubing was unclamped and the pump was filled easily without overpressure. The needle was removed; the area was cleaned, and a bandage was placed over the needle insertion site.    After reprogramming, the following settings were noted:    Drug/Concentration: Bupivacaine 5 mg/mL  and Fentanyl 200 mcg  Infusion Mode/Rate: Simple continuous at 100 mcg per day  Reservoir Volume: 19.3 mL  Low Reservoir Alarm Date: 09/04/19    Disposition: The patient tolerated the procedure well, and there were no apparent complications.     Estimated Blood Loss: none    Complications: none

## 2019-08-04 ENCOUNTER — Ambulatory Visit: Admit: 2019-08-03 | Discharge: 2019-08-04

## 2019-08-04 DIAGNOSIS — M47816 Spondylosis without myelopathy or radiculopathy, lumbar region: Secondary | ICD-10-CM

## 2019-08-04 DIAGNOSIS — Z7901 Long term (current) use of anticoagulants: Secondary | ICD-10-CM

## 2019-08-04 DIAGNOSIS — G894 Chronic pain syndrome: Secondary | ICD-10-CM

## 2019-08-04 DIAGNOSIS — Z451 Encounter for adjustment and management of infusion pump: Principal | ICD-10-CM

## 2019-08-22 ENCOUNTER — Ambulatory Visit: Admit: 2019-08-22 | Discharge: 2019-08-22 | Payer: MEDICARE

## 2019-08-22 ENCOUNTER — Encounter: Admit: 2019-08-22 | Discharge: 2019-08-22 | Payer: MEDICARE

## 2019-08-30 ENCOUNTER — Ambulatory Visit: Admit: 2019-08-30 | Discharge: 2019-08-30 | Payer: MEDICARE

## 2019-08-30 ENCOUNTER — Encounter: Admit: 2019-08-30 | Discharge: 2019-08-30 | Payer: MEDICARE

## 2019-08-30 DIAGNOSIS — I10 Essential (primary) hypertension: Secondary | ICD-10-CM

## 2019-08-30 DIAGNOSIS — I499 Cardiac arrhythmia, unspecified: Secondary | ICD-10-CM

## 2019-08-30 MED ORDER — INTRATHECAL PAIN PUMP SYR- NON UKH PHARMACY
1 | Freq: Once | INTRATHECAL | 0 refills | Status: CP
Start: 2019-08-30 — End: ?
  Administered 2019-08-30: 21:00:00 1 via INTRATHECAL

## 2019-08-31 ENCOUNTER — Encounter: Admit: 2019-08-31 | Discharge: 2019-08-31 | Payer: MEDICARE

## 2019-10-18 ENCOUNTER — Encounter: Admit: 2019-10-18 | Discharge: 2019-10-18 | Payer: MEDICARE

## 2019-10-18 DIAGNOSIS — G894 Chronic pain syndrome: Secondary | ICD-10-CM

## 2019-10-18 DIAGNOSIS — M47816 Spondylosis without myelopathy or radiculopathy, lumbar region: Secondary | ICD-10-CM

## 2019-10-18 DIAGNOSIS — M961 Postlaminectomy syndrome, not elsewhere classified: Secondary | ICD-10-CM

## 2019-10-18 DIAGNOSIS — Z451 Encounter for adjustment and management of infusion pump: Secondary | ICD-10-CM

## 2019-10-24 ENCOUNTER — Encounter: Admit: 2019-10-24 | Discharge: 2019-10-24 | Payer: MEDICARE

## 2019-10-24 ENCOUNTER — Ambulatory Visit: Admit: 2019-10-24 | Discharge: 2019-10-24 | Payer: MEDICARE

## 2019-10-24 DIAGNOSIS — I10 Essential (primary) hypertension: Secondary | ICD-10-CM

## 2019-10-24 DIAGNOSIS — I499 Cardiac arrhythmia, unspecified: Secondary | ICD-10-CM

## 2019-10-24 DIAGNOSIS — Z451 Encounter for adjustment and management of infusion pump: Secondary | ICD-10-CM

## 2019-10-24 MED ORDER — INTRATHECAL PAIN PUMP SYR- NON UKH PHARMACY
1 | Freq: Once | INTRATHECAL | 0 refills | Status: CP
Start: 2019-10-24 — End: ?
  Administered 2019-10-24: 18:00:00 1 via INTRATHECAL

## 2019-10-24 NOTE — Procedures
INTERVENTIONAL PAIN MANAGEMENT PROCEDURE REPORT    Intrathecal Pump Refill & Reprogramming    Date of Service: 10/24/2019    Procedure Title(s):    1. Electronic analysis, reprogramming & refill of infusion pump   2. Refill & maintenance of infusion pump    Attending Surgeon: Thayer Dallas Hussaini, APRN-NP     Pre-Procedure Diagnosis:   1. Adjustment and management of infusion pump        Post-Procedure Diagnosis:   1. Adjustment and management of infusion pump        Indications: Kristopher Richard is a 73 y.o. male with above diagnosis. The patient is here today for refill and reprogramming of an implanted intrathecal infusion pump. The patient's history and physical exam were reviewed. The risks, benefits and alternatives to the procedure were discussed, and all questions were answered to the patient's satisfaction. The patient agreed to proceed, and written informed consent was obtained.     Procedure in Detail: The patient was brought into the exam room and was in a supine position on the exam table. Interrogation was performed and revealed the following settings:    Drug/Concentration: Bupivacaine 20 mg/mL  and Fentanyl 400 mcg  Infusion Mode/Rate: constant flow at 111 mcg per day  Reservoir Volume: 4.022 mL    The pump site was then prepped with chloroprep and draped in a sterile manner. The refill template was placed over the pump and the refill port was located. A 22-gauge Huber needle with tubing (clamped) was advanced into the refill port. A 20 mL syringe was attached and aspiration was steady until 4 mL were withdrawn and bubbles were noted. The tubing was re-clamped and a new syringe containing 20 mL of Bupivacaine 20 mg/mL  and Fentanyl 400 mcg was attached. The tubing was unclamped and the pump was filled easily without overpressure. The needle was removed; the area was cleaned, and a bandage was placed over the needle insertion site.    After reprogramming, the following settings were noted: Drug/Concentration: Bupivacaine 20 mg/mL  and Fentanyl 400 mcg  Infusion Mode/Rate: constant flow at 145 mcg per day  Reservoir Volume: 19.3 mL  Low Reservoir Alarm Date: 12/07/2019    Disposition: The patient tolerated the procedure well, and there were no apparent complications.     Estimated Blood Loss: none    Complications: none

## 2019-11-14 ENCOUNTER — Ambulatory Visit: Admit: 2019-11-14 | Discharge: 2019-11-15 | Payer: MEDICARE

## 2019-11-14 ENCOUNTER — Encounter: Admit: 2019-11-14 | Discharge: 2019-11-14 | Payer: MEDICARE

## 2019-11-14 DIAGNOSIS — M2550 Pain in unspecified joint: Secondary | ICD-10-CM

## 2019-11-14 DIAGNOSIS — F419 Anxiety disorder, unspecified: Secondary | ICD-10-CM

## 2019-11-14 DIAGNOSIS — N200 Calculus of kidney: Secondary | ICD-10-CM

## 2019-11-14 DIAGNOSIS — M5134 Other intervertebral disc degeneration, thoracic region: Secondary | ICD-10-CM

## 2019-11-14 DIAGNOSIS — M797 Fibromyalgia: Secondary | ICD-10-CM

## 2019-11-14 DIAGNOSIS — R519 Generalized headaches: Secondary | ICD-10-CM

## 2019-11-14 DIAGNOSIS — B1910 Unspecified viral hepatitis B without hepatic coma: Secondary | ICD-10-CM

## 2019-11-14 DIAGNOSIS — I10 Essential (primary) hypertension: Secondary | ICD-10-CM

## 2019-11-14 DIAGNOSIS — M961 Postlaminectomy syndrome, not elsewhere classified: Secondary | ICD-10-CM

## 2019-11-14 DIAGNOSIS — G894 Chronic pain syndrome: Secondary | ICD-10-CM

## 2019-11-14 DIAGNOSIS — M5136 Other intervertebral disc degeneration, lumbar region: Secondary | ICD-10-CM

## 2019-11-14 DIAGNOSIS — C801 Malignant (primary) neoplasm, unspecified: Secondary | ICD-10-CM

## 2019-11-14 DIAGNOSIS — I499 Cardiac arrhythmia, unspecified: Secondary | ICD-10-CM

## 2019-11-14 DIAGNOSIS — F329 Major depressive disorder, single episode, unspecified: Secondary | ICD-10-CM

## 2019-11-14 IMAGING — CR CHEST
4 series · 4 of 4 positions shown · non-contrast
Comparison: none

[shoulder external]
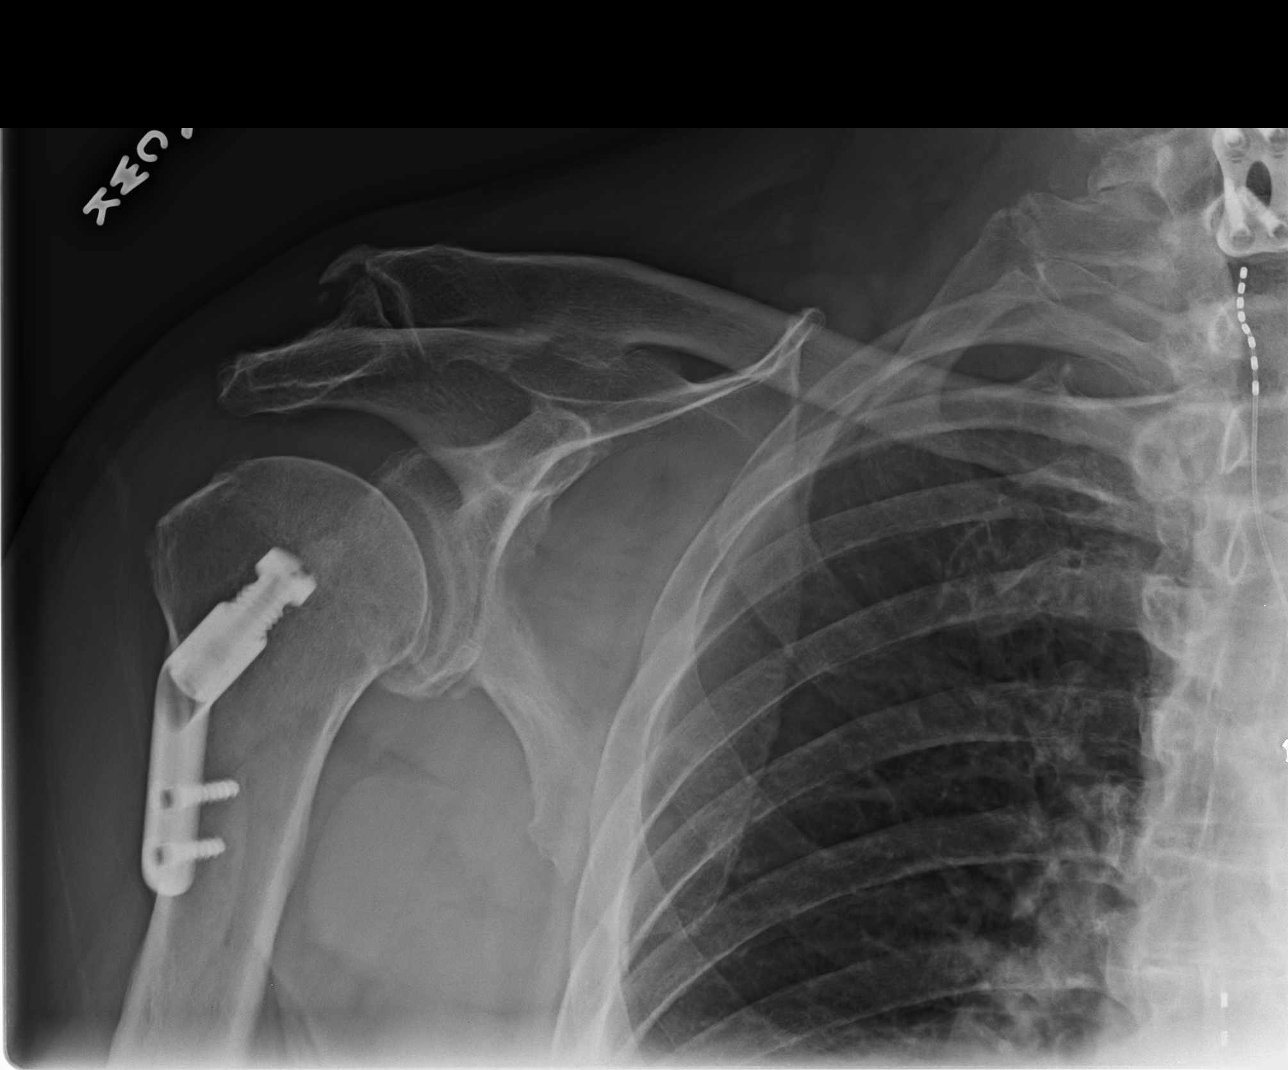

[shoulder internal]
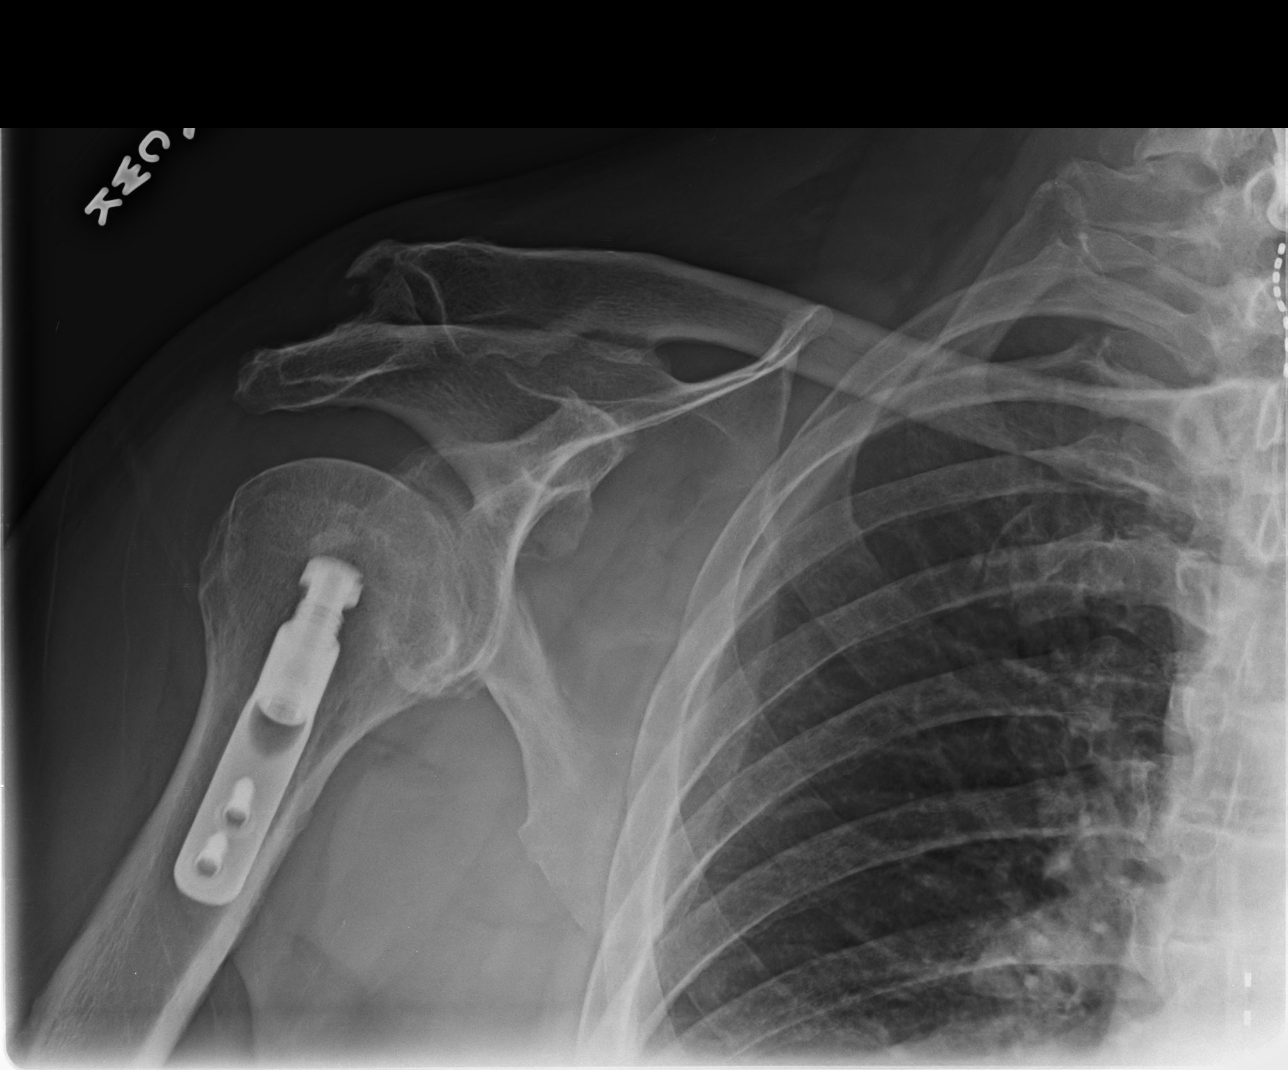

[shoulder y-view]
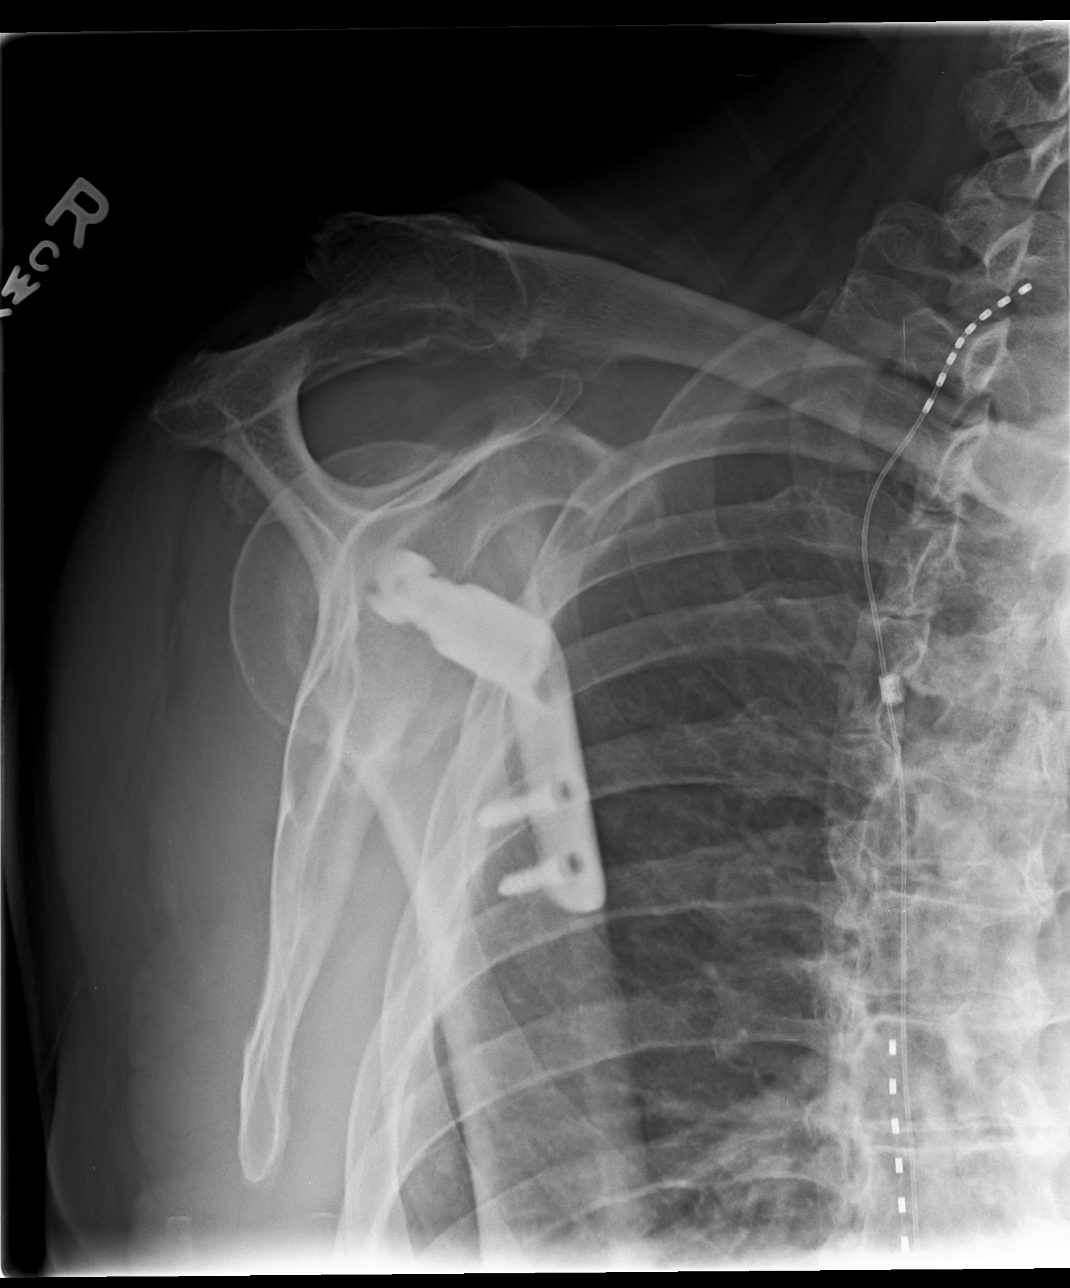

[shoulder axillary]
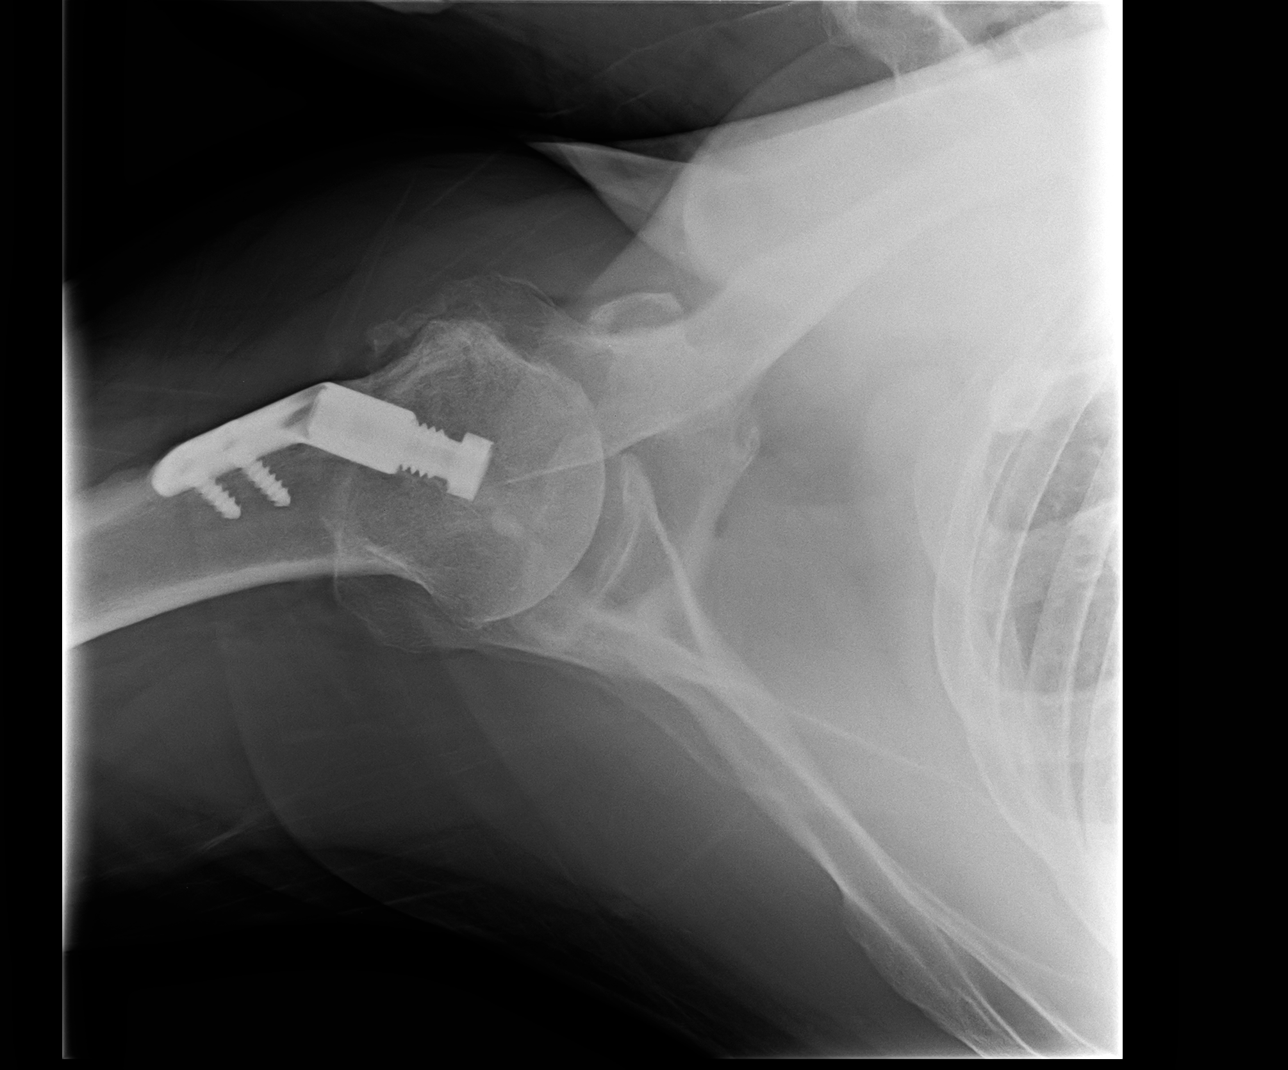

[4 of 4 positions shown; findings below may reference images not displayed]

DIAGNOSTIC STUDIES

EXAM
Right shoulder

INDICATION
right shoulder pain
RT. SHOULDER PAIN. HX: AVN.

TECHNIQUE
[Internally and externally rotated views as well as lateral views and axillary views were obtained.

COMPARISONS
None available

FINDINGS
Patient is status post internal fixation right humeral neck. No acute fractures are seen.
Degenerative changes of the AC joint are evident. Neural stimulator and postop changes of the
cervical spine are partially imaged.

IMPRESSION
No acute fractures. There are postoperative changes of the right humerus as well as the cervical
spine with neural stimulator in place.
Moderate to marked degenerative changes of the AC joint.

Tech Notes:

RT. SHOULDER PAIN. HX: AVN.

## 2019-11-14 NOTE — Progress Notes
[facility] NOTE VIA TELE-HEALTH    Todays visit took place via telephone. Total time 10 minutes.     Obtained patient's verbal consent to treat them and their agreement to [facility] financial policy and NPP via this telehealth visit during the [facility]         SUBJECTIVE:   Fentanyl itp   Increased to   Had adverse reaction, increased pain   Feeling better after dose reduction of fentanyl back to     Still having moderate pain though           Review of Systems   Constitutional: Negative.    HENT: Negative.    Eyes: Negative.    Respiratory: Negative.    Cardiovascular: Negative.    Gastrointestinal: Negative.    Endocrine: Negative.    Genitourinary: Negative.    Musculoskeletal: Positive for arthralgias, myalgias, neck pain and neck stiffness.   Skin: Negative.    Allergic/Immunologic: Negative.    Neurological: Negative.    Hematological: Negative.    Psychiatric/Behavioral: Negative.        Current Outpatient Medications:   ?  doxazosin (CARDURA) 2 mg tablet, Take 4 mg by mouth at bedtime daily., Disp: , Rfl:   ?  ELIQUIS 5 mg tablet, Take 5 mg by mouth twice daily., Disp: , Rfl:   ?  EPINEPHrine (EPIPEN) 1 mg/mL injection pen (2-Pack), Inject 0.3 mg (1 Pen) into thigh if needed for anaphylactic reaction. May repeat in 5-15 minutes if needed.  Indications: a significant type of allergic reaction called anaphylaxis, Disp: 2 each, Rfl: 0  ?  fentaNYL citrate PF (SUBLIMAZE) 50 mcg/mL 50 mcg/mL in 20 mL intrathecal infusion syr, by Intrathecal route Continuous. Please call [facility] (Dr. Janyth Contes and [facility]) for settings., Disp: , Rfl:   ?  fluticasone propionate (FLONASE) 50 mcg/actuation nasal spray, suspension, Apply 50 sprays into nose as directed as Needed., Disp: , Rfl:   ?  hydroCHLOROthiazide (HYDRODIURIL) 25 mg tablet, Take 25 mg by mouth every morning., Disp: , Rfl: ?  metoprolol tartrate (LOPRESSOR) 100 mg tablet, Take 100 mg by mouth twice daily., Disp: , Rfl:   ?  multivit-min-FA-lycopen-lutein (CENTRUM SILVER MEN) 300-600-300 mcg tab, Take 1 tablet by mouth daily., Disp: , Rfl:   ?  orphenadrine ER (NORFLEX) 100 mg tablet, Take 100 mg by mouth twice daily., Disp: , Rfl:   ?  senna/docusate (SENOKOT-S) 8.6/50 mg tablet, Take 1 tablet by mouth twice daily., Disp: , Rfl:   ?  temazepam (RESTORIL) 15 mg capsule, Take 15 mg by mouth at bedtime as needed., Disp: , Rfl:   ?  valsartan (DIOVAN) 160 mg tablet, Take 160 mg by mouth daily., Disp: , Rfl:     Current Facility-Administered Medications:   ?  fentaNYL citrate PF (SUBLIMAZE) injection 100 mcg, 100 mcg, Intravenous, Q2 MIN PRN, [name], MD, 50 mcg at 05/23/19 1441  ?  midazolam (VERSED) injection 1-2 mg, 1-2 mg, Intravenous, Q2 MIN PRN, [name], MD, 2 mg at 05/23/19 1438  No Known Allergies    Physical Exam    Vitals:    11/14/19 0954   Weight: 93.4 kg (206 lb)   Height: 177.8 cm (70)   PainSc: Eight     Oswestry Total Score:: 54  Pain Score: Eight  Body mass index is 29.56 kg/m?Marland Kitchen           IMPRESSION:  1. Postlaminectomy syndrome, lumbar region  2. Chronic pain syndrome        PLAN:    Plan for ITP med change to morphine at visit in Jan

## 2019-11-28 ENCOUNTER — Encounter: Admit: 2019-11-28 | Discharge: 2019-11-28 | Payer: MEDICARE

## 2019-11-29 ENCOUNTER — Encounter: Admit: 2019-11-29 | Discharge: 2019-11-29 | Payer: MEDICARE

## 2019-11-29 ENCOUNTER — Ambulatory Visit: Admit: 2019-11-29 | Discharge: 2019-11-29 | Payer: MEDICARE

## 2019-11-29 DIAGNOSIS — C801 Malignant (primary) neoplasm, unspecified: Secondary | ICD-10-CM

## 2019-11-29 DIAGNOSIS — I10 Essential (primary) hypertension: Secondary | ICD-10-CM

## 2019-11-29 DIAGNOSIS — I499 Cardiac arrhythmia, unspecified: Secondary | ICD-10-CM

## 2019-11-29 DIAGNOSIS — M5134 Other intervertebral disc degeneration, thoracic region: Secondary | ICD-10-CM

## 2019-11-29 DIAGNOSIS — M5136 Other intervertebral disc degeneration, lumbar region: Secondary | ICD-10-CM

## 2019-11-29 DIAGNOSIS — Z451 Encounter for adjustment and management of infusion pump: Secondary | ICD-10-CM

## 2019-11-29 DIAGNOSIS — N200 Calculus of kidney: Secondary | ICD-10-CM

## 2019-11-29 DIAGNOSIS — M797 Fibromyalgia: Secondary | ICD-10-CM

## 2019-11-29 DIAGNOSIS — F329 Major depressive disorder, single episode, unspecified: Secondary | ICD-10-CM

## 2019-11-29 DIAGNOSIS — M2550 Pain in unspecified joint: Secondary | ICD-10-CM

## 2019-11-29 DIAGNOSIS — F419 Anxiety disorder, unspecified: Secondary | ICD-10-CM

## 2019-11-29 DIAGNOSIS — B1910 Unspecified viral hepatitis B without hepatic coma: Secondary | ICD-10-CM

## 2019-11-29 DIAGNOSIS — R519 Generalized headaches: Secondary | ICD-10-CM

## 2019-11-29 MED ORDER — MORPHINE 9.6MG/ML + BUPIVACAINE 4.6MG/ML IT PAIN PUMP SYR
Freq: Once | INTRATHECAL | 0 refills | Status: CP
Start: 2019-11-29 — End: ?

## 2019-11-29 NOTE — Procedures
INTERVENTIONAL PAIN MANAGEMENT PROCEDURE REPORT    Intrathecal Pump Refill & Reprogramming    Date of Service: 11/29/2019    Procedure Title(s):    1. Electronic analysis, reprogramming & refill of infusion pump   2. Refill & maintenance of infusion pump    Attending Surgeon: Thayer Dallas Hussaini, APRN-NP     Pre-Procedure Diagnosis:   1. Adjustment and management of infusion pump        Post-Procedure Diagnosis:   1. Adjustment and management of infusion pump        Indications: Kristopher Richard is a 73 y.o. male with above diagnosis. The patient is here today for refill and reprogramming of an implanted intrathecal infusion pump. The patient's history and physical exam were reviewed. The risks, benefits and alternatives to the procedure were discussed, and all questions were answered to the patient's satisfaction. The patient agreed to proceed, and written informed consent was obtained.     Procedure in Detail: The patient was brought into the exam room and was in a supine position on the exam table. Interrogation was performed and revealed the following settings:    Drug/Concentration: Bupivacaine 4.6 mg/mL  and Morphine 9.6 mg/mL  Infusion Mode/Rate: constant flow at 0.2 mg per day  Reservoir Volume: 8.957 mL    The pump site was then prepped with chloroprep and draped in a sterile manner. The refill template was placed over the pump and the refill port was located. A 22-gauge Huber needle with tubing (clamped) was advanced into the refill port. A 20 mL syringe was attached and aspiration was steady until 9 mL were withdrawn and bubbles were noted. The tubing was re-clamped and a new syringe containing 20 mL of Bupivacaine 4.6 mg/mL  and Morphine 9.6 mg/mL was attached. The tubing was unclamped and the pump was filled easily without overpressure. The needle was removed; the area was cleaned, and a bandage was placed over the needle insertion site.    After reprogramming, the following settings were noted: Drug/Concentration: Bupivacaine 4.6 mg/mL  and Morphine 9.6 mg/mL  Infusion Mode/Rate: constant flow at 0.2 mg per day  Bridge Bolus 0.1642mg  for 52 hours 33 minutes  Reservoir Volume: 19.3 mL  Low Reservoir Alarm Date: 01/02/22    Disposition: The patient tolerated the procedure well, and there were no apparent complications.     Estimated Blood Loss: none    Complications: none

## 2019-12-03 ENCOUNTER — Encounter: Admit: 2019-12-03 | Discharge: 2019-12-03 | Payer: MEDICARE

## 2019-12-03 DIAGNOSIS — I10 Essential (primary) hypertension: Secondary | ICD-10-CM

## 2019-12-03 DIAGNOSIS — N200 Calculus of kidney: Secondary | ICD-10-CM

## 2019-12-03 DIAGNOSIS — M5136 Other intervertebral disc degeneration, lumbar region: Secondary | ICD-10-CM

## 2019-12-03 DIAGNOSIS — M2550 Pain in unspecified joint: Secondary | ICD-10-CM

## 2019-12-03 DIAGNOSIS — F329 Major depressive disorder, single episode, unspecified: Secondary | ICD-10-CM

## 2019-12-03 DIAGNOSIS — B1910 Unspecified viral hepatitis B without hepatic coma: Secondary | ICD-10-CM

## 2019-12-03 DIAGNOSIS — F419 Anxiety disorder, unspecified: Secondary | ICD-10-CM

## 2019-12-03 DIAGNOSIS — M5134 Other intervertebral disc degeneration, thoracic region: Secondary | ICD-10-CM

## 2019-12-03 DIAGNOSIS — I499 Cardiac arrhythmia, unspecified: Secondary | ICD-10-CM

## 2019-12-03 DIAGNOSIS — C801 Malignant (primary) neoplasm, unspecified: Secondary | ICD-10-CM

## 2019-12-03 DIAGNOSIS — R519 Generalized headaches: Secondary | ICD-10-CM

## 2019-12-03 DIAGNOSIS — M797 Fibromyalgia: Secondary | ICD-10-CM

## 2019-12-13 ENCOUNTER — Encounter: Admit: 2019-12-13 | Discharge: 2019-12-13 | Payer: MEDICARE

## 2019-12-13 DIAGNOSIS — G894 Chronic pain syndrome: Secondary | ICD-10-CM

## 2019-12-13 NOTE — Telephone Encounter
Recent change in ITP medicine 12.31 fent to MsO4. He was unable to make recent appt to incr his ITP dose. He is nearly bedridden with congestion and Headache, low BP, nausea. He describes hypersensitivity to touch. He has not had an incr in Mso4 since the change. He has no PTM today.  Pos Covid result 12/07/19.   Consider arranging Home Visit for ITP adjustment.    Will forward to Z. Hussaini for consideration.

## 2019-12-25 ENCOUNTER — Encounter: Admit: 2019-12-25 | Discharge: 2019-12-25 | Payer: MEDICARE

## 2019-12-25 ENCOUNTER — Ambulatory Visit: Admit: 2019-12-25 | Discharge: 2019-12-25 | Payer: MEDICARE

## 2019-12-25 DIAGNOSIS — C801 Malignant (primary) neoplasm, unspecified: Secondary | ICD-10-CM

## 2019-12-25 DIAGNOSIS — M5134 Other intervertebral disc degeneration, thoracic region: Secondary | ICD-10-CM

## 2019-12-25 DIAGNOSIS — B1910 Unspecified viral hepatitis B without hepatic coma: Secondary | ICD-10-CM

## 2019-12-25 DIAGNOSIS — R519 Generalized headaches: Secondary | ICD-10-CM

## 2019-12-25 DIAGNOSIS — M5136 Other intervertebral disc degeneration, lumbar region: Secondary | ICD-10-CM

## 2019-12-25 DIAGNOSIS — M797 Fibromyalgia: Secondary | ICD-10-CM

## 2019-12-25 DIAGNOSIS — N200 Calculus of kidney: Secondary | ICD-10-CM

## 2019-12-25 DIAGNOSIS — M2550 Pain in unspecified joint: Secondary | ICD-10-CM

## 2019-12-25 DIAGNOSIS — I10 Essential (primary) hypertension: Secondary | ICD-10-CM

## 2019-12-25 DIAGNOSIS — I499 Cardiac arrhythmia, unspecified: Secondary | ICD-10-CM

## 2019-12-25 DIAGNOSIS — F419 Anxiety disorder, unspecified: Secondary | ICD-10-CM

## 2019-12-25 DIAGNOSIS — F329 Major depressive disorder, single episode, unspecified: Secondary | ICD-10-CM

## 2019-12-25 DIAGNOSIS — U071 COVID-19: Secondary | ICD-10-CM

## 2019-12-27 ENCOUNTER — Encounter: Admit: 2019-12-27 | Discharge: 2019-12-27 | Payer: MEDICARE

## 2019-12-27 DIAGNOSIS — F329 Major depressive disorder, single episode, unspecified: Secondary | ICD-10-CM

## 2019-12-27 DIAGNOSIS — M5134 Other intervertebral disc degeneration, thoracic region: Secondary | ICD-10-CM

## 2019-12-27 DIAGNOSIS — I499 Cardiac arrhythmia, unspecified: Secondary | ICD-10-CM

## 2019-12-27 DIAGNOSIS — R519 Generalized headaches: Secondary | ICD-10-CM

## 2019-12-27 DIAGNOSIS — M2550 Pain in unspecified joint: Secondary | ICD-10-CM

## 2019-12-27 DIAGNOSIS — C801 Malignant (primary) neoplasm, unspecified: Secondary | ICD-10-CM

## 2019-12-27 DIAGNOSIS — F419 Anxiety disorder, unspecified: Secondary | ICD-10-CM

## 2019-12-27 DIAGNOSIS — N200 Calculus of kidney: Secondary | ICD-10-CM

## 2019-12-27 DIAGNOSIS — U071 COVID-19: Secondary | ICD-10-CM

## 2019-12-27 DIAGNOSIS — M5136 Other intervertebral disc degeneration, lumbar region: Secondary | ICD-10-CM

## 2019-12-27 DIAGNOSIS — I10 Essential (primary) hypertension: Secondary | ICD-10-CM

## 2019-12-27 DIAGNOSIS — M797 Fibromyalgia: Secondary | ICD-10-CM

## 2019-12-27 DIAGNOSIS — B1910 Unspecified viral hepatitis B without hepatic coma: Secondary | ICD-10-CM

## 2020-01-01 ENCOUNTER — Ambulatory Visit: Admit: 2020-01-01 | Discharge: 2020-01-01 | Payer: MEDICARE

## 2020-01-01 ENCOUNTER — Encounter: Admit: 2020-01-01 | Discharge: 2020-01-01 | Payer: MEDICARE

## 2020-01-01 DIAGNOSIS — I10 Essential (primary) hypertension: Secondary | ICD-10-CM

## 2020-01-01 DIAGNOSIS — M797 Fibromyalgia: Secondary | ICD-10-CM

## 2020-01-01 DIAGNOSIS — F329 Major depressive disorder, single episode, unspecified: Secondary | ICD-10-CM

## 2020-01-01 DIAGNOSIS — M5136 Other intervertebral disc degeneration, lumbar region: Secondary | ICD-10-CM

## 2020-01-01 DIAGNOSIS — C801 Malignant (primary) neoplasm, unspecified: Secondary | ICD-10-CM

## 2020-01-01 DIAGNOSIS — M961 Postlaminectomy syndrome, not elsewhere classified: Secondary | ICD-10-CM

## 2020-01-01 DIAGNOSIS — M5134 Other intervertebral disc degeneration, thoracic region: Secondary | ICD-10-CM

## 2020-01-01 DIAGNOSIS — I499 Cardiac arrhythmia, unspecified: Secondary | ICD-10-CM

## 2020-01-01 DIAGNOSIS — U071 COVID-19: Secondary | ICD-10-CM

## 2020-01-01 DIAGNOSIS — N200 Calculus of kidney: Secondary | ICD-10-CM

## 2020-01-01 DIAGNOSIS — Z978 Presence of other specified devices: Secondary | ICD-10-CM

## 2020-01-01 DIAGNOSIS — F419 Anxiety disorder, unspecified: Secondary | ICD-10-CM

## 2020-01-01 DIAGNOSIS — R519 Generalized headaches: Secondary | ICD-10-CM

## 2020-01-01 DIAGNOSIS — M2550 Pain in unspecified joint: Secondary | ICD-10-CM

## 2020-01-01 DIAGNOSIS — G894 Chronic pain syndrome: Secondary | ICD-10-CM

## 2020-01-01 DIAGNOSIS — B1910 Unspecified viral hepatitis B without hepatic coma: Secondary | ICD-10-CM

## 2020-01-01 NOTE — Procedures
ANESTHESIA PROCEDURE REPORT    Intrathecal Pump Analysis & Reprogramming    Date of Service: 01/01/2020    Procedure Title(s):    1. Electronic analysis & reprogramming of infusion pump    Attending Surgeon: Blanchard Kelch Yenifer Saccente, APRN-NP    Pre-Procedure Diagnosis:   1. Postlaminectomy syndrome, lumbar region    2. Chronic pain syndrome    3. Presence of intrathecal pump        Post-Procedure Diagnosis:   1. Postlaminectomy syndrome, lumbar region    2. Chronic pain syndrome    3. Presence of intrathecal pump        Indications: Kristopher Richard is a 74 y.o. male with above diagnosis. The patient is here today for reprogramming of an implanted intrathecal infusion pump. The patient's history and physical exam were reviewed. The risks, benefits and alternatives to the procedure were discussed, and all questions were answered to the patient's satisfaction.     Procedure in Detail: The patient was in a seated position in a chair. Interrogation was performed and revealed the following settings:    Drug/Concentration: Bupivacaine 4.6 mg/mL  and Morphine 9.6 mg/mL Reservoir Capacity: 20 mL  Infusion Mode/Rate: Simple continuous at 0.25 mg per day  Patient Controlled Bolus: none  Reservoir Volume: 18 mL    After reprogramming, the following settings were noted:    Infusion Mode/Rate: Simple continuous at 0.30 mg per day  Patient Controlled Bolus: none   Low Reservoir Alarm Date: June 2021     Disposition: The patient tolerated the procedure well, and there were no apparent complications.     Estimated Blood Loss: none    Complications: none

## 2020-01-01 NOTE — Progress Notes
[facility] NOTE       SUBJECTIVE:   Chronic back pain   No change since last visit   Not worse but not better from pain perspective   Mildly improving from COVID brain fog feeling   No itching or intolerance to his ITP   Tolerating morphine   Has stopped taking Claritin and not having any itching so far  Comes in for dose adjustment          Review of Systems   Constitutional: Positive for fatigue and unexpected weight change.   HENT: Positive for rhinorrhea and tinnitus.    Eyes: Negative.    Respiratory: Positive for apnea and cough.    Cardiovascular: Negative.    Gastrointestinal: Positive for abdominal pain.   Endocrine: Negative.    Genitourinary: Positive for difficulty urinating and frequency.   Musculoskeletal: Positive for arthralgias, back pain, joint swelling, myalgias, neck pain and neck stiffness.   Allergic/Immunologic: Negative.    Neurological: Negative.    Hematological: Negative.    Psychiatric/Behavioral: Negative.        Current Outpatient Medications:   ?  doxazosin (CARDURA) 2 mg tablet, Take 4 mg by mouth at bedtime daily., Disp: , Rfl:   ?  ELIQUIS 5 mg tablet, Take 5 mg by mouth twice daily., Disp: , Rfl:   ?  EPINEPHrine (EPIPEN) 1 mg/mL injection pen (2-Pack), Inject 0.3 mg (1 Pen) into thigh if needed for anaphylactic reaction. May repeat in 5-15 minutes if needed.  Indications: a significant type of allergic reaction called anaphylaxis, Disp: 2 each, Rfl: 0  ?  fentaNYL citrate PF (SUBLIMAZE) 50 mcg/mL 50 mcg/mL in 20 mL intrathecal infusion syr, by Intrathecal route Continuous. Please call [facility] (Dr. Janyth Contes and [facility]) for settings., Disp: , Rfl:   ?  fluticasone propionate (FLONASE) 50 mcg/actuation nasal spray, suspension, Apply 50 sprays into nose as directed as Needed., Disp: , Rfl:   ?  hydroCHLOROthiazide (HYDRODIURIL) 25 mg tablet, Take 25 mg by mouth every morning., Disp: , Rfl: ?  metoprolol tartrate (LOPRESSOR) 100 mg tablet, Take 100 mg by mouth twice daily., Disp: , Rfl:   ?  multivit-min-FA-lycopen-lutein (CENTRUM SILVER MEN) 300-600-300 mcg tab, Take 1 tablet by mouth daily., Disp: , Rfl:   ?  orphenadrine ER (NORFLEX) 100 mg tablet, Take 100 mg by mouth twice daily., Disp: , Rfl:   ?  senna/docusate (SENOKOT-S) 8.6/50 mg tablet, Take 1 tablet by mouth twice daily., Disp: , Rfl:   ?  temazepam (RESTORIL) 15 mg capsule, Take 15 mg by mouth at bedtime as needed., Disp: , Rfl:   ?  valsartan (DIOVAN) 160 mg tablet, Take 160 mg by mouth daily., Disp: , Rfl:   No Known Allergies  Physical Exam  Vitals:    01/01/20 1446   BP: 124/69   Pulse: 56   SpO2: 100%   Weight: 87.1 kg (192 lb)   Height: 177.8 cm (70)   PainSc: Eight        Pain Score: Eight  Body mass index is 27.55 kg/m?Marland Kitchen    General: Alert, oriented, mild distress  HEENT: normocephalic  NECK: TTP along c spine and paracerv muscles. Limited ROM  Resp: Non labored breathing, no distress  Cardio: Pedal pulses palpable bilaterally, mild lower extremity edema  MS: TTP in lumbar region of spine and paraspinal muscles.  NEURO: Cranial nerves II-XII intact. Motor strength & sensory unchanged.  Gait coordinated.   Behavior: Calm, cooperative, behavior  and speech normal.         IMPRESSION:  1. Postlaminectomy syndrome, lumbar region    2. Chronic pain syndrome    3. Presence of intrathecal pump          PLAN:  increase ITP 20%   F/u in 1 week to reeval

## 2020-01-07 ENCOUNTER — Encounter: Admit: 2020-01-07 | Discharge: 2020-01-07 | Payer: MEDICARE

## 2020-01-08 ENCOUNTER — Encounter: Admit: 2020-01-08 | Discharge: 2020-01-08 | Payer: MEDICARE

## 2020-01-08 ENCOUNTER — Ambulatory Visit: Admit: 2020-01-08 | Discharge: 2020-01-08 | Payer: MEDICARE

## 2020-01-08 DIAGNOSIS — M5136 Other intervertebral disc degeneration, lumbar region: Secondary | ICD-10-CM

## 2020-01-08 DIAGNOSIS — B1910 Unspecified viral hepatitis B without hepatic coma: Secondary | ICD-10-CM

## 2020-01-08 DIAGNOSIS — N200 Calculus of kidney: Secondary | ICD-10-CM

## 2020-01-08 DIAGNOSIS — I499 Cardiac arrhythmia, unspecified: Secondary | ICD-10-CM

## 2020-01-08 DIAGNOSIS — F329 Major depressive disorder, single episode, unspecified: Secondary | ICD-10-CM

## 2020-01-08 DIAGNOSIS — R519 Generalized headaches: Secondary | ICD-10-CM

## 2020-01-08 DIAGNOSIS — C801 Malignant (primary) neoplasm, unspecified: Secondary | ICD-10-CM

## 2020-01-08 DIAGNOSIS — M2550 Pain in unspecified joint: Secondary | ICD-10-CM

## 2020-01-08 DIAGNOSIS — Z978 Presence of other specified devices: Secondary | ICD-10-CM

## 2020-01-08 DIAGNOSIS — M797 Fibromyalgia: Secondary | ICD-10-CM

## 2020-01-08 DIAGNOSIS — F419 Anxiety disorder, unspecified: Secondary | ICD-10-CM

## 2020-01-08 DIAGNOSIS — U071 COVID-19: Secondary | ICD-10-CM

## 2020-01-08 DIAGNOSIS — I10 Essential (primary) hypertension: Secondary | ICD-10-CM

## 2020-01-08 DIAGNOSIS — M5134 Other intervertebral disc degeneration, thoracic region: Secondary | ICD-10-CM

## 2020-01-08 NOTE — Procedures
ANESTHESIA PROCEDURE REPORT    Intrathecal Pump Analysis & Reprogramming    Date of Service: 01/08/2020    Procedure Title(s):    1. Electronic analysis & reprogramming of infusion pump    Attending Surgeon: Blanchard Kelch Cylis Ayars, APRN-NP    Pre-Procedure Diagnosis:   1. Presence of intrathecal pump        Post-Procedure Diagnosis:   1. Presence of intrathecal pump        Indications: Kristopher Richard is a 74 y.o. male with above diagnosis. The patient is here today for reprogramming of an implanted intrathecal infusion pump. The patient's history and physical exam were reviewed. The risks, benefits and alternatives to the procedure were discussed, and all questions were answered to the patient's satisfaction.     Procedure in Detail: The patient was in a seated position in a chair. Interrogation was performed and revealed the following settings:    Drug/Concentration: Bupivacaine 4.6 mg/mL  and Morphine 9.6 mg/mL Reservoir Capacity: 20 mL  Infusion Mode/Rate: Simple continuous at 0.30 mg per day  Patient Controlled Bolus: NONE   Reservoir Volume: 18 mL    After reprogramming, the following settings were noted:    Infusion Mode/Rate: Simple continuous at 0.35 mg per day  Patient Controlled Bolus: none  Low Reservoir Alarm Date: 02/24/21     Disposition: The patient tolerated the procedure well, and there were no apparent complications.     Estimated Blood Loss: none    Complications: none

## 2020-01-17 ENCOUNTER — Encounter: Admit: 2020-01-17 | Discharge: 2020-01-17 | Payer: MEDICARE

## 2020-01-17 ENCOUNTER — Ambulatory Visit: Admit: 2020-01-17 | Discharge: 2020-01-17 | Payer: MEDICARE

## 2020-01-17 DIAGNOSIS — R519 Generalized headaches: Secondary | ICD-10-CM

## 2020-01-17 DIAGNOSIS — M797 Fibromyalgia: Secondary | ICD-10-CM

## 2020-01-17 DIAGNOSIS — B1910 Unspecified viral hepatitis B without hepatic coma: Secondary | ICD-10-CM

## 2020-01-17 DIAGNOSIS — N200 Calculus of kidney: Secondary | ICD-10-CM

## 2020-01-17 DIAGNOSIS — I10 Essential (primary) hypertension: Secondary | ICD-10-CM

## 2020-01-17 DIAGNOSIS — M5136 Other intervertebral disc degeneration, lumbar region: Secondary | ICD-10-CM

## 2020-01-17 DIAGNOSIS — F329 Major depressive disorder, single episode, unspecified: Secondary | ICD-10-CM

## 2020-01-17 DIAGNOSIS — U071 COVID-19: Secondary | ICD-10-CM

## 2020-01-17 DIAGNOSIS — C801 Malignant (primary) neoplasm, unspecified: Secondary | ICD-10-CM

## 2020-01-17 DIAGNOSIS — M5134 Other intervertebral disc degeneration, thoracic region: Secondary | ICD-10-CM

## 2020-01-17 DIAGNOSIS — F419 Anxiety disorder, unspecified: Secondary | ICD-10-CM

## 2020-01-17 DIAGNOSIS — I499 Cardiac arrhythmia, unspecified: Secondary | ICD-10-CM

## 2020-01-17 DIAGNOSIS — M2550 Pain in unspecified joint: Secondary | ICD-10-CM

## 2020-01-24 ENCOUNTER — Encounter: Admit: 2020-01-24 | Discharge: 2020-01-24 | Payer: MEDICARE

## 2020-01-31 ENCOUNTER — Encounter: Admit: 2020-01-31 | Discharge: 2020-01-31 | Payer: MEDICARE

## 2020-01-31 ENCOUNTER — Ambulatory Visit: Admit: 2020-01-31 | Discharge: 2020-01-31 | Payer: MEDICARE

## 2020-01-31 DIAGNOSIS — M2550 Pain in unspecified joint: Secondary | ICD-10-CM

## 2020-01-31 DIAGNOSIS — C801 Malignant (primary) neoplasm, unspecified: Secondary | ICD-10-CM

## 2020-01-31 DIAGNOSIS — R519 Generalized headaches: Secondary | ICD-10-CM

## 2020-01-31 DIAGNOSIS — F329 Major depressive disorder, single episode, unspecified: Secondary | ICD-10-CM

## 2020-01-31 DIAGNOSIS — I499 Cardiac arrhythmia, unspecified: Secondary | ICD-10-CM

## 2020-01-31 DIAGNOSIS — F419 Anxiety disorder, unspecified: Secondary | ICD-10-CM

## 2020-01-31 DIAGNOSIS — M797 Fibromyalgia: Secondary | ICD-10-CM

## 2020-01-31 DIAGNOSIS — B1910 Unspecified viral hepatitis B without hepatic coma: Secondary | ICD-10-CM

## 2020-01-31 DIAGNOSIS — U071 COVID-19: Secondary | ICD-10-CM

## 2020-01-31 DIAGNOSIS — M5136 Other intervertebral disc degeneration, lumbar region: Secondary | ICD-10-CM

## 2020-01-31 DIAGNOSIS — I10 Essential (primary) hypertension: Secondary | ICD-10-CM

## 2020-01-31 DIAGNOSIS — M5134 Other intervertebral disc degeneration, thoracic region: Secondary | ICD-10-CM

## 2020-01-31 DIAGNOSIS — N200 Calculus of kidney: Secondary | ICD-10-CM

## 2020-02-01 ENCOUNTER — Encounter: Admit: 2020-02-01 | Discharge: 2020-02-01 | Payer: MEDICARE

## 2020-02-01 DIAGNOSIS — G894 Chronic pain syndrome: Secondary | ICD-10-CM

## 2020-02-01 DIAGNOSIS — Z978 Presence of other specified devices: Secondary | ICD-10-CM

## 2020-02-06 ENCOUNTER — Encounter: Admit: 2020-02-06 | Discharge: 2020-02-06 | Payer: MEDICARE

## 2020-02-06 ENCOUNTER — Ambulatory Visit: Admit: 2020-02-06 | Discharge: 2020-02-06 | Payer: MEDICARE

## 2020-02-06 DIAGNOSIS — N200 Calculus of kidney: Secondary | ICD-10-CM

## 2020-02-06 DIAGNOSIS — F329 Major depressive disorder, single episode, unspecified: Secondary | ICD-10-CM

## 2020-02-06 DIAGNOSIS — G894 Chronic pain syndrome: Secondary | ICD-10-CM

## 2020-02-06 DIAGNOSIS — M961 Postlaminectomy syndrome, not elsewhere classified: Secondary | ICD-10-CM

## 2020-02-06 DIAGNOSIS — R519 Generalized headaches: Secondary | ICD-10-CM

## 2020-02-06 DIAGNOSIS — B1910 Unspecified viral hepatitis B without hepatic coma: Secondary | ICD-10-CM

## 2020-02-06 DIAGNOSIS — I10 Essential (primary) hypertension: Secondary | ICD-10-CM

## 2020-02-06 DIAGNOSIS — M797 Fibromyalgia: Secondary | ICD-10-CM

## 2020-02-06 DIAGNOSIS — C801 Malignant (primary) neoplasm, unspecified: Secondary | ICD-10-CM

## 2020-02-06 DIAGNOSIS — Z451 Encounter for adjustment and management of infusion pump: Secondary | ICD-10-CM

## 2020-02-06 DIAGNOSIS — F419 Anxiety disorder, unspecified: Secondary | ICD-10-CM

## 2020-02-06 DIAGNOSIS — M5134 Other intervertebral disc degeneration, thoracic region: Secondary | ICD-10-CM

## 2020-02-06 DIAGNOSIS — U071 COVID-19: Secondary | ICD-10-CM

## 2020-02-06 DIAGNOSIS — I499 Cardiac arrhythmia, unspecified: Secondary | ICD-10-CM

## 2020-02-06 DIAGNOSIS — M5136 Other intervertebral disc degeneration, lumbar region: Secondary | ICD-10-CM

## 2020-02-06 DIAGNOSIS — M2550 Pain in unspecified joint: Secondary | ICD-10-CM

## 2020-02-06 NOTE — Procedures
ANESTHESIA PROCEDURE REPORT    Intrathecal Pump Analysis & Reprogramming    Date of Service: 02/06/2020    Procedure Title(s):    1. Electronic analysis & reprogramming of infusion pump    Attending Surgeon: Blanchard Kelch Ihsan Nomura, APRN-NP    Pre-Procedure Diagnosis:   1. Chronic pain syndrome    2. Adjustment and management of infusion pump    3. Postlaminectomy syndrome, lumbar region        Post-Procedure Diagnosis:   1. Chronic pain syndrome    2. Adjustment and management of infusion pump    3. Postlaminectomy syndrome, lumbar region        Indications: Kristopher Richard is a 74 y.o. male with above diagnosis. The patient is here today for reprogramming of an implanted intrathecal infusion pump. The patient's history and physical exam were reviewed. The risks, benefits and alternatives to the procedure were discussed, and all questions were answered to the patient's satisfaction.     Procedure in Detail: The patient was in a seated position in a chair. Interrogation was performed and revealed the following settings:    Drug/Concentration: Bupivacaine 4.6 mg/mL  and Morphine 9.6 mg/mL Reservoir Capacity: 20 mL  Infusion Mode/Rate: Simple continuous at 0.2 mg per day  Patient Controlled Bolus: 0.1 mg per dose every 6 hours up to 3 max per 24 hours   Reservoir Volume: 16.9 mL    After reprogramming, the following settings were noted:    Infusion Mode/Rate: Simple continuous at 0.1 mg per day  Patient Controlled Bolus: 0.15 mg per dose every 6 hours up to 4 max per 24 hours   Low Reservoir Alarm Date: 16.9     Disposition: The patient tolerated the procedure well, and there were no apparent complications.     Estimated Blood Loss: none    Complications: none

## 2020-02-14 ENCOUNTER — Encounter: Admit: 2020-02-14 | Discharge: 2020-02-14 | Payer: MEDICARE

## 2020-02-14 ENCOUNTER — Ambulatory Visit: Admit: 2020-02-14 | Discharge: 2020-02-14 | Payer: MEDICARE

## 2020-02-14 DIAGNOSIS — F329 Major depressive disorder, single episode, unspecified: Secondary | ICD-10-CM

## 2020-02-14 DIAGNOSIS — C801 Malignant (primary) neoplasm, unspecified: Secondary | ICD-10-CM

## 2020-02-14 DIAGNOSIS — M961 Postlaminectomy syndrome, not elsewhere classified: Secondary | ICD-10-CM

## 2020-02-14 DIAGNOSIS — R519 Generalized headaches: Secondary | ICD-10-CM

## 2020-02-14 DIAGNOSIS — Z451 Encounter for adjustment and management of infusion pump: Secondary | ICD-10-CM

## 2020-02-14 DIAGNOSIS — M797 Fibromyalgia: Secondary | ICD-10-CM

## 2020-02-14 DIAGNOSIS — U071 COVID-19: Secondary | ICD-10-CM

## 2020-02-14 DIAGNOSIS — F419 Anxiety disorder, unspecified: Secondary | ICD-10-CM

## 2020-02-14 DIAGNOSIS — M5134 Other intervertebral disc degeneration, thoracic region: Secondary | ICD-10-CM

## 2020-02-14 DIAGNOSIS — B1910 Unspecified viral hepatitis B without hepatic coma: Secondary | ICD-10-CM

## 2020-02-14 DIAGNOSIS — M5136 Other intervertebral disc degeneration, lumbar region: Secondary | ICD-10-CM

## 2020-02-14 DIAGNOSIS — N200 Calculus of kidney: Secondary | ICD-10-CM

## 2020-02-14 DIAGNOSIS — M2550 Pain in unspecified joint: Secondary | ICD-10-CM

## 2020-02-14 DIAGNOSIS — I499 Cardiac arrhythmia, unspecified: Secondary | ICD-10-CM

## 2020-02-14 DIAGNOSIS — I10 Essential (primary) hypertension: Secondary | ICD-10-CM

## 2020-02-14 NOTE — Procedures
ANESTHESIA PROCEDURE REPORT    Intrathecal Pump Analysis & Reprogramming    Date of Service: 02/14/2020    Procedure Title(s):    1. Electronic analysis & reprogramming of infusion pump    Attending Surgeon: Blanchard Kelch Renato Spellman, APRN-NP    Pre-Procedure Diagnosis:   1. Adjustment and management of infusion pump    2. Postlaminectomy syndrome, lumbar region        Post-Procedure Diagnosis:   1. Adjustment and management of infusion pump    2. Postlaminectomy syndrome, lumbar region        Indications: Kristopher Richard is a 74 y.o. male with above diagnosis. The patient is here today for reprogramming of an implanted intrathecal infusion pump. The patient's history and physical exam were reviewed. The risks, benefits and alternatives to the procedure were discussed, and all questions were answered to the patient's satisfaction.     Procedure in Detail: The patient was in a seated position in a chair. Interrogation was performed and revealed the following settings:    Drug/Concentration: Bupivacaine 4.6 mg/mL  and Morphine 9.6 mg/mL Reservoir Capacity: 20 mL  Infusion Mode/Rate: Simple continuous at 0.1 mg per day  Patient Controlled Bolus: 0.15 mg per dose every 4 hours up to 4 max per 24 hours     After reprogramming, the following settings were noted:    Infusion Mode/Rate: Periodic at 0mg  basal daily,  Periodic Bolus: 0.16 mg per dose every 4.48 hours up to 5 max per 24 hours   Low Reservoir Alarm Date: 06/2020     Disposition: The patient tolerated the procedure well, and there were no apparent complications.     Estimated Blood Loss: none    Complications: none

## 2020-02-14 NOTE — Progress Notes
[facility] CLINIC NOTE       SUBJECTIVE:   Chronic back pain   Has itp morphine   Had dose adjustment at last visit from 0.5mg  dialy rate to 0.7mg  with PTC intervals every 4 hours at 0.15mg  per dose   He does not feel better   Has had increased fatigue  Wonders if this is post covid lingering effects            Review of Systems   Constitutional: Positive for activity change, appetite change and fatigue.   HENT: Positive for postnasal drip and tinnitus.    Eyes: Positive for visual disturbance.   Respiratory: Positive for apnea.    Cardiovascular: Negative.    Gastrointestinal: Positive for abdominal pain.   Endocrine: Positive for polydipsia.   Genitourinary: Positive for frequency.   Musculoskeletal: Positive for arthralgias, back pain, gait problem, joint swelling, myalgias, neck pain and neck stiffness.   Skin: Negative.    Allergic/Immunologic: Negative.    Neurological: Positive for light-headedness.   Hematological: Bruises/bleeds easily.   Psychiatric/Behavioral: Positive for agitation and decreased concentration.       Current Outpatient Medications:   ?  doxazosin (CARDURA) 2 mg tablet, Take 4 mg by mouth at bedtime daily., Disp: , Rfl:   ?  ELIQUIS 5 mg tablet, Take 5 mg by mouth twice daily., Disp: , Rfl:   ?  EPINEPHrine (EPIPEN) 1 mg/mL injection pen (2-Pack), Inject 0.3 mg (1 Pen) into thigh if needed for anaphylactic reaction. May repeat in 5-15 minutes if needed.  Indications: a significant type of allergic reaction called anaphylaxis, Disp: 2 each, Rfl: 0  ?  fentaNYL citrate PF (SUBLIMAZE) 50 mcg/mL 50 mcg/mL in 20 mL intrathecal infusion syr, by Intrathecal route Continuous. Please call [facility] (Dr. Janyth Contes and [facility]) for settings., Disp: , Rfl:   ?  fluticasone propionate (FLONASE) 50 mcg/actuation nasal spray, suspension, Apply 50 sprays into nose as directed as Needed., Disp: , Rfl:   ?  hydroCHLOROthiazide (HYDRODIURIL) 25 mg tablet, Take 25 mg by mouth every morning., Disp: , Rfl:   ?  metoprolol tartrate (LOPRESSOR) 100 mg tablet, Take 100 mg by mouth twice daily., Disp: , Rfl:   ?  multivit-min-FA-lycopen-lutein (CENTRUM SILVER MEN) 300-600-300 mcg tab, Take 1 tablet by mouth daily., Disp: , Rfl:   ?  orphenadrine ER (NORFLEX) 100 mg tablet, Take 100 mg by mouth twice daily., Disp: , Rfl:   ?  senna/docusate (SENOKOT-S) 8.6/50 mg tablet, Take 1 tablet by mouth twice daily., Disp: , Rfl:   ?  temazepam (RESTORIL) 15 mg capsule, Take 15 mg by mouth at bedtime as needed., Disp: , Rfl:   ?  valsartan (DIOVAN) 160 mg tablet, Take 160 mg by mouth daily., Disp: , Rfl:   No Known Allergies  Physical Exam  Vitals:    02/14/20 1448   BP: 105/59   Pulse: 56   SpO2: 99%   Weight: 87.1 kg (192 lb)   Height: 177.8 cm (70)   PainSc: Eight        Pain Score: Eight  Body mass index is 27.55 kg/m?Marland Kitchen    General: Alert, oriented, mild distress  HEENT: normocephalic  Resp: Non labored breathing, no distress  Cardio: Pedal pulses palpable bilaterally, mild lower extremity edema  MS: TTP in lumbar region of spine and paraspinal muscles.  NEURO: Cranial nerves II-XII intact. Motor strength & sensory exams unchanged . Gait antalgic.   Behavior: Calm, cooperative, behavior and  speech normal.          IMPRESSION:  1. Adjustment and management of infusion pump    2. Postlaminectomy syndrome, lumbar region          PLAN:  itp changed to periodic with 5 doses per day   Total daily dose increased to 0.8mg    Will monitor for changes   Plan f.u in 1 week   May consider changing back to continuous flow mode if not getting better with periodic dosing

## 2020-02-21 NOTE — Patient Instructions
It was nice to see you today.  Thank you for choosing to visit our clinic.  Your time is important and if you had to wait today, we do apologize.  Our goal is to run exactly on time; however, on occasion, we get behind in clinic due to unexpected patient issues. Thank you for your patience.     General Instructions:   Scheduling:  If you would like to make an appointment, please call our scheduling line at [phone].   How to reach me:  Please send a MyChart message to the [facility] or leave a voicemail for the nurse at [phone].   How to get a medication refill:  Please use the MyChart Refill request or contact your pharmacy directly to request medication refills.  We do not do same day refills on controlled substances.   How to receive your test results:  If you have signed up for MyChart, you will receive your test results and messages from me this way.  If you are expecting results and have not heard from my office within 2 weeks of your testing, please send a MyChart message or call my office.   Appointment Reminders on your cell phone:  Make sure we have your cell phone number, and Text [place] to [phone].   Support for many chronic illnesses is available through [facility]: [email or web address] or [phone].      Again, thank you for coming in today.

## 2020-02-27 ENCOUNTER — Ambulatory Visit: Admit: 2020-02-27 | Discharge: 2020-02-27 | Payer: MEDICARE

## 2020-02-27 ENCOUNTER — Encounter: Admit: 2020-02-27 | Discharge: 2020-02-27 | Payer: MEDICARE

## 2020-02-27 DIAGNOSIS — C801 Malignant (primary) neoplasm, unspecified: Secondary | ICD-10-CM

## 2020-02-27 DIAGNOSIS — M5134 Other intervertebral disc degeneration, thoracic region: Secondary | ICD-10-CM

## 2020-02-27 DIAGNOSIS — U071 COVID-19: Secondary | ICD-10-CM

## 2020-02-27 DIAGNOSIS — R519 Generalized headaches: Secondary | ICD-10-CM

## 2020-02-27 DIAGNOSIS — F419 Anxiety disorder, unspecified: Secondary | ICD-10-CM

## 2020-02-27 DIAGNOSIS — M797 Fibromyalgia: Secondary | ICD-10-CM

## 2020-02-27 DIAGNOSIS — I10 Essential (primary) hypertension: Secondary | ICD-10-CM

## 2020-02-27 DIAGNOSIS — F329 Major depressive disorder, single episode, unspecified: Secondary | ICD-10-CM

## 2020-02-27 DIAGNOSIS — Z451 Encounter for adjustment and management of infusion pump: Secondary | ICD-10-CM

## 2020-02-27 DIAGNOSIS — I499 Cardiac arrhythmia, unspecified: Secondary | ICD-10-CM

## 2020-02-27 DIAGNOSIS — N200 Calculus of kidney: Secondary | ICD-10-CM

## 2020-02-27 DIAGNOSIS — M2550 Pain in unspecified joint: Secondary | ICD-10-CM

## 2020-02-27 DIAGNOSIS — M961 Postlaminectomy syndrome, not elsewhere classified: Secondary | ICD-10-CM

## 2020-02-27 DIAGNOSIS — M5136 Other intervertebral disc degeneration, lumbar region: Secondary | ICD-10-CM

## 2020-02-27 DIAGNOSIS — B1910 Unspecified viral hepatitis B without hepatic coma: Secondary | ICD-10-CM

## 2020-02-27 NOTE — Procedures
ANESTHESIA PROCEDURE REPORT    Intrathecal Pump Analysis & Reprogramming    Date of Service: 02/27/2020    Procedure Title(s):    1. Electronic analysis & reprogramming of infusion pump    Attending Surgeon: Blanchard Kelch Jarvis Knodel, APRN-NP    Pre-Procedure Diagnosis:   1. Adjustment and management of infusion pump    2. Postlaminectomy syndrome, lumbar region        Post-Procedure Diagnosis:   1. Adjustment and management of infusion pump    2. Postlaminectomy syndrome, lumbar region        Indications: Kristopher Richard is a 74 y.o. male with above diagnosis. The patient is here today for reprogramming of an implanted intrathecal infusion pump. The patient's history and physical exam were reviewed. The risks, benefits and alternatives to the procedure were discussed, and all questions were answered to the patient's satisfaction.     Procedure in Detail: The patient was in a seated position in a chair. Interrogation was performed and revealed the following settings:    Drug/Concentration: Bupivacaine 4.6 mg/mL  and Morphine 9.6 mg/mL Reservoir Capacity: 20 mL  Infusion Mode/Rate: Periodic flow - total daily rate at 0.8 mg per day  Periodic flow pump controlled Bolus: 0.16 mg per dose every 5 hours up to 5 max per 24 hours   Reservoir Volume: 15.39 mL    After reprogramming, the following settings were noted:    Infusion Mode/Rate: Periodic flow - total daily rate at 0.9 mg per day  Periodic Pump Controlled Bolus: 0.18 mg per dose every 5 hours up to 5 max per 24 hours   Low Reservoir Alarm Date: 06/2020     Disposition: The patient tolerated the procedure well, and there were no apparent complications.     Estimated Blood Loss: none    Complications: none

## 2020-02-27 NOTE — Progress Notes
[facility] CLINIC NOTE       SUBJECTIVE:   Chronic back pain   Increase in ITP Morphine at last visit to periodic flow  0.16mg  5 x daily for 0.8mg  daily dose   This is starting to feel better   He has been able to tolerate activity with less intensity of pain   Comes in for eval and would like further increase today   Has mild itch but managed with OTC antihistamines  He is also inquiring about  medical [place]ari[place] laws   Wondering if this is something that could be considered while on ITP morphine            Review of Systems   Constitutional: Negative.    HENT: Negative.    Eyes: Negative.    Respiratory: Negative.    Cardiovascular: Negative.    Gastrointestinal: Negative.    Endocrine: Negative.    Genitourinary: Negative.    Musculoskeletal: Positive for back pain.   Skin: Negative.    Allergic/Immunologic: Negative.    Neurological: Negative.    Hematological: Negative.    Psychiatric/Behavioral: Negative.        Current Outpatient Medications:   ?  doxazosin (CARDURA) 2 mg tablet, Take 4 mg by mouth at bedtime daily., Disp: , Rfl:   ?  ELIQUIS 5 mg tablet, Take 5 mg by mouth twice daily., Disp: , Rfl:   ?  EPINEPHrine (EPIPEN) 1 mg/mL injection pen (2-Pack), Inject 0.3 mg (1 Pen) into thigh if needed for anaphylactic reaction. May repeat in 5-15 minutes if needed.  Indications: a significant type of allergic reaction called anaphylaxis, Disp: 2 each, Rfl: 0  ?  fentaNYL citrate PF (SUBLIMAZE) 50 mcg/mL 50 mcg/mL in 20 mL intrathecal infusion syr, by Intrathecal route Continuous. Please call [facility] (Dr. Janyth Contes and [facility]) for settings., Disp: , Rfl:   ?  fluticasone propionate (FLONASE) 50 mcg/actuation nasal spray, suspension, Apply 50 sprays into nose as directed as Needed., Disp: , Rfl:   ?  hydroCHLOROthiazide (HYDRODIURIL) 25 mg tablet, Take 25 mg by mouth every morning., Disp: , Rfl:   ?  metoprolol tartrate (LOPRESSOR) 100 mg tablet, Take 100 mg by mouth twice daily., Disp: , Rfl:   ?  multivit-min-FA-lycopen-lutein (CENTRUM SILVER MEN) 300-600-300 mcg tab, Take 1 tablet by mouth daily., Disp: , Rfl:   ?  orphenadrine ER (NORFLEX) 100 mg tablet, Take 100 mg by mouth twice daily., Disp: , Rfl:   ?  senna/docusate (SENOKOT-S) 8.6/50 mg tablet, Take 1 tablet by mouth twice daily., Disp: , Rfl:   ?  temazepam (RESTORIL) 15 mg capsule, Take 15 mg by mouth at bedtime as needed., Disp: , Rfl:   ?  valsartan (DIOVAN) 160 mg tablet, Take 160 mg by mouth daily., Disp: , Rfl:   No Known Allergies  Physical Exam  Vitals:    02/27/20 1440   Weight: 87.1 kg (192 lb)   Height: 177.8 cm (70)           Body mass index is 27.55 kg/m?Marland Kitchen    General: Alert, oriented, no acute distress  HEENT: normocephalic  Resp: Non labored breathing, no distress  Cardio: Pedal pulses palpable bilaterally, no lower extremity edema  MS: TTP in lumbar region of spine and paraspinal muscles.  NEURO: Cranial nerves II-XII intact. Motor strength & sensory exams unchanged. Gait antalgic.   Behavior: Calm, cooperative, behavior and speech normal.         IMPRESSION:  1.  Adjustment and management of infusion pump    2. Postlaminectomy syndrome, lumbar region        PLAN:    Advised against marijuana use with controlled opioids  He is starting to notice positive changes with his ITP so far   Will increase periodic flow today to 0.18mg  5 x daily for 0.9mg  total daily dose   Plan 2 week f.u or if better he can cancel   Next pump refill no later than June   Pt thankful for information

## 2020-03-12 ENCOUNTER — Encounter: Admit: 2020-03-12 | Discharge: 2020-03-12 | Payer: MEDICARE

## 2020-03-12 ENCOUNTER — Ambulatory Visit: Admit: 2020-03-12 | Discharge: 2020-03-12 | Payer: MEDICARE

## 2020-03-12 DIAGNOSIS — I10 Essential (primary) hypertension: Secondary | ICD-10-CM

## 2020-03-12 DIAGNOSIS — I499 Cardiac arrhythmia, unspecified: Secondary | ICD-10-CM

## 2020-03-12 DIAGNOSIS — M5136 Other intervertebral disc degeneration, lumbar region: Secondary | ICD-10-CM

## 2020-03-12 DIAGNOSIS — F329 Major depressive disorder, single episode, unspecified: Secondary | ICD-10-CM

## 2020-03-12 DIAGNOSIS — Z451 Encounter for adjustment and management of infusion pump: Secondary | ICD-10-CM

## 2020-03-12 DIAGNOSIS — B1910 Unspecified viral hepatitis B without hepatic coma: Secondary | ICD-10-CM

## 2020-03-12 DIAGNOSIS — M961 Postlaminectomy syndrome, not elsewhere classified: Secondary | ICD-10-CM

## 2020-03-12 DIAGNOSIS — M797 Fibromyalgia: Secondary | ICD-10-CM

## 2020-03-12 DIAGNOSIS — M5134 Other intervertebral disc degeneration, thoracic region: Secondary | ICD-10-CM

## 2020-03-12 DIAGNOSIS — N200 Calculus of kidney: Secondary | ICD-10-CM

## 2020-03-12 DIAGNOSIS — R519 Generalized headaches: Secondary | ICD-10-CM

## 2020-03-12 DIAGNOSIS — U071 COVID-19: Secondary | ICD-10-CM

## 2020-03-12 DIAGNOSIS — F419 Anxiety disorder, unspecified: Secondary | ICD-10-CM

## 2020-03-12 DIAGNOSIS — C801 Malignant (primary) neoplasm, unspecified: Secondary | ICD-10-CM

## 2020-03-12 DIAGNOSIS — M2550 Pain in unspecified joint: Secondary | ICD-10-CM

## 2020-04-09 ENCOUNTER — Encounter: Admit: 2020-04-09 | Discharge: 2020-04-09 | Payer: MEDICARE

## 2020-04-09 ENCOUNTER — Ambulatory Visit: Admit: 2020-04-09 | Discharge: 2020-04-10 | Payer: MEDICARE

## 2020-04-09 DIAGNOSIS — U071 COVID-19: Secondary | ICD-10-CM

## 2020-04-09 DIAGNOSIS — M5136 Other intervertebral disc degeneration, lumbar region: Secondary | ICD-10-CM

## 2020-04-09 DIAGNOSIS — M961 Postlaminectomy syndrome, not elsewhere classified: Secondary | ICD-10-CM

## 2020-04-09 DIAGNOSIS — M2550 Pain in unspecified joint: Secondary | ICD-10-CM

## 2020-04-09 DIAGNOSIS — R519 Generalized headaches: Secondary | ICD-10-CM

## 2020-04-09 DIAGNOSIS — I499 Cardiac arrhythmia, unspecified: Secondary | ICD-10-CM

## 2020-04-09 DIAGNOSIS — M5134 Other intervertebral disc degeneration, thoracic region: Secondary | ICD-10-CM

## 2020-04-09 DIAGNOSIS — F419 Anxiety disorder, unspecified: Secondary | ICD-10-CM

## 2020-04-09 DIAGNOSIS — I10 Essential (primary) hypertension: Secondary | ICD-10-CM

## 2020-04-09 DIAGNOSIS — B1910 Unspecified viral hepatitis B without hepatic coma: Secondary | ICD-10-CM

## 2020-04-09 DIAGNOSIS — Z451 Encounter for adjustment and management of infusion pump: Secondary | ICD-10-CM

## 2020-04-09 DIAGNOSIS — F329 Major depressive disorder, single episode, unspecified: Secondary | ICD-10-CM

## 2020-04-09 DIAGNOSIS — C801 Malignant (primary) neoplasm, unspecified: Secondary | ICD-10-CM

## 2020-04-09 DIAGNOSIS — M797 Fibromyalgia: Secondary | ICD-10-CM

## 2020-04-09 DIAGNOSIS — N200 Calculus of kidney: Secondary | ICD-10-CM

## 2020-04-09 MED ORDER — MORPHINE 9.6MG/ML + BUPIVACAINE 4.6MG/ML IT PAIN PUMP SYR
Freq: Once | INTRATHECAL | 0 refills | Status: CP
Start: 2020-04-09 — End: ?
  Administered 2020-04-09 (×3): 20.000 mL via INTRATHECAL

## 2020-04-09 NOTE — Patient Instructions
It was nice to see you today.  Thank you for choosing to visit our clinic.  Your time is important and if you had to wait today, we do apologize.  Our goal is to run exactly on time; however, on occasion, we get behind in clinic due to unexpected patient issues. Thank you for your patience.     General Instructions:  ? Scheduling:  If you would like to make an appointment, please call our scheduling line at [phone].  ? How to reach me:  Please send a MyChart message to the [facility] or leave a voicemail for the nurse at [phone].  ? How to get a medication refill:  Please use the MyChart Refill request or contact your pharmacy directly to request medication refills.  We do not do same day refills on controlled substances.  ? How to receive your test results:  If you have signed up for MyChart, you will receive your test results and messages from me this way.  If you are expecting results and have not heard from my office within 2 weeks of your testing, please send a MyChart message or call my office.  ? Appointment Reminders on your cell phone:  Make sure we have your cell phone number, and Text [place] to [phone].  ? Support for many chronic illnesses is available through [facility]: [email or web address] or [phone].      Again, thank you for coming in today.

## 2020-04-30 ENCOUNTER — Encounter: Admit: 2020-04-30 | Discharge: 2020-04-30 | Payer: MEDICARE

## 2020-04-30 ENCOUNTER — Ambulatory Visit: Admit: 2020-04-30 | Discharge: 2020-04-30 | Payer: MEDICARE

## 2020-04-30 DIAGNOSIS — M47816 Spondylosis without myelopathy or radiculopathy, lumbar region: Secondary | ICD-10-CM

## 2020-04-30 DIAGNOSIS — R519 Generalized headaches: Secondary | ICD-10-CM

## 2020-04-30 DIAGNOSIS — M961 Postlaminectomy syndrome, not elsewhere classified: Secondary | ICD-10-CM

## 2020-04-30 DIAGNOSIS — C801 Malignant (primary) neoplasm, unspecified: Secondary | ICD-10-CM

## 2020-04-30 DIAGNOSIS — B1910 Unspecified viral hepatitis B without hepatic coma: Secondary | ICD-10-CM

## 2020-04-30 DIAGNOSIS — U071 COVID-19: Secondary | ICD-10-CM

## 2020-04-30 DIAGNOSIS — M2550 Pain in unspecified joint: Secondary | ICD-10-CM

## 2020-04-30 DIAGNOSIS — F419 Anxiety disorder, unspecified: Secondary | ICD-10-CM

## 2020-04-30 DIAGNOSIS — M797 Fibromyalgia: Secondary | ICD-10-CM

## 2020-04-30 DIAGNOSIS — I10 Essential (primary) hypertension: Secondary | ICD-10-CM

## 2020-04-30 DIAGNOSIS — M5136 Other intervertebral disc degeneration, lumbar region: Secondary | ICD-10-CM

## 2020-04-30 DIAGNOSIS — M545 Low back pain: Secondary | ICD-10-CM

## 2020-04-30 DIAGNOSIS — I499 Cardiac arrhythmia, unspecified: Secondary | ICD-10-CM

## 2020-04-30 DIAGNOSIS — F329 Major depressive disorder, single episode, unspecified: Secondary | ICD-10-CM

## 2020-04-30 DIAGNOSIS — M5134 Other intervertebral disc degeneration, thoracic region: Secondary | ICD-10-CM

## 2020-04-30 DIAGNOSIS — N200 Calculus of kidney: Secondary | ICD-10-CM

## 2020-05-02 ENCOUNTER — Encounter: Admit: 2020-05-02 | Discharge: 2020-05-02 | Payer: MEDICARE

## 2020-05-02 ENCOUNTER — Ambulatory Visit: Admit: 2020-05-02 | Discharge: 2020-05-02 | Payer: MEDICARE

## 2020-05-02 DIAGNOSIS — M47816 Spondylosis without myelopathy or radiculopathy, lumbar region: Secondary | ICD-10-CM

## 2020-05-02 DIAGNOSIS — M545 Low back pain: Secondary | ICD-10-CM

## 2020-05-14 NOTE — Patient Instructions
It was a pleasure seeing you in clinic today at the [facility] at [facility].    We encourage you to review the following to learn more about your health:  Education attached to this visit summary  [email or web address]    For questions about your plan of care, don't hesitate to send us a MyChart message or call our nursing line at  [phone].    For questions related to scheduling, please call our scheduling line at [phone].     If you need a refill of your medication, please contact your pharmacy directly or use the Refill Request option on MyChart. Please allow 72 hours for these requests to be completed. Please note that certain medications may require regular clinic visits     For any imaging or labs;  if you're active on MyChart, you will receive a notification once your test results have been reviewed & released by your physician. If you are not active on MyChart, you will receive your results at your next clinic visit. We will call you if there are any urgent findings.

## 2020-05-15 ENCOUNTER — Encounter: Admit: 2020-05-15 | Discharge: 2020-05-15 | Payer: MEDICARE

## 2020-05-15 ENCOUNTER — Ambulatory Visit: Admit: 2020-05-15 | Discharge: 2020-05-15 | Payer: MEDICARE

## 2020-05-15 DIAGNOSIS — M5134 Other intervertebral disc degeneration, thoracic region: Secondary | ICD-10-CM

## 2020-05-15 DIAGNOSIS — C801 Malignant (primary) neoplasm, unspecified: Secondary | ICD-10-CM

## 2020-05-15 DIAGNOSIS — U071 COVID-19: Secondary | ICD-10-CM

## 2020-05-15 DIAGNOSIS — R519 Generalized headaches: Secondary | ICD-10-CM

## 2020-05-15 DIAGNOSIS — N200 Calculus of kidney: Secondary | ICD-10-CM

## 2020-05-15 DIAGNOSIS — B1910 Unspecified viral hepatitis B without hepatic coma: Secondary | ICD-10-CM

## 2020-05-15 DIAGNOSIS — F329 Major depressive disorder, single episode, unspecified: Secondary | ICD-10-CM

## 2020-05-15 DIAGNOSIS — F419 Anxiety disorder, unspecified: Secondary | ICD-10-CM

## 2020-05-15 DIAGNOSIS — I10 Essential (primary) hypertension: Secondary | ICD-10-CM

## 2020-05-15 DIAGNOSIS — M5136 Other intervertebral disc degeneration, lumbar region: Secondary | ICD-10-CM

## 2020-05-15 DIAGNOSIS — M797 Fibromyalgia: Secondary | ICD-10-CM

## 2020-05-15 DIAGNOSIS — I499 Cardiac arrhythmia, unspecified: Secondary | ICD-10-CM

## 2020-05-15 DIAGNOSIS — M2550 Pain in unspecified joint: Secondary | ICD-10-CM

## 2020-05-15 DIAGNOSIS — M5416 Radiculopathy, lumbar region: Secondary | ICD-10-CM

## 2020-05-19 ENCOUNTER — Encounter: Admit: 2020-05-19 | Discharge: 2020-05-19 | Payer: MEDICARE

## 2020-05-20 ENCOUNTER — Encounter: Admit: 2020-05-20 | Discharge: 2020-05-20 | Payer: MEDICARE

## 2020-05-21 ENCOUNTER — Encounter: Admit: 2020-05-21 | Discharge: 2020-05-21 | Payer: MEDICARE

## 2020-05-27 ENCOUNTER — Encounter: Admit: 2020-05-27 | Discharge: 2020-05-27 | Payer: MEDICARE

## 2020-05-28 ENCOUNTER — Ambulatory Visit: Admit: 2020-05-28 | Discharge: 2020-05-28 | Payer: MEDICARE

## 2020-05-28 ENCOUNTER — Encounter: Admit: 2020-05-28 | Discharge: 2020-05-28 | Payer: MEDICARE

## 2020-05-28 DIAGNOSIS — R519 Generalized headaches: Secondary | ICD-10-CM

## 2020-05-28 DIAGNOSIS — M5417 Radiculopathy, lumbosacral region: Secondary | ICD-10-CM

## 2020-05-28 DIAGNOSIS — I10 Essential (primary) hypertension: Secondary | ICD-10-CM

## 2020-05-28 DIAGNOSIS — F419 Anxiety disorder, unspecified: Secondary | ICD-10-CM

## 2020-05-28 DIAGNOSIS — U071 COVID-19: Secondary | ICD-10-CM

## 2020-05-28 DIAGNOSIS — M5134 Other intervertebral disc degeneration, thoracic region: Secondary | ICD-10-CM

## 2020-05-28 DIAGNOSIS — N200 Calculus of kidney: Secondary | ICD-10-CM

## 2020-05-28 DIAGNOSIS — C801 Malignant (primary) neoplasm, unspecified: Secondary | ICD-10-CM

## 2020-05-28 DIAGNOSIS — M797 Fibromyalgia: Secondary | ICD-10-CM

## 2020-05-28 DIAGNOSIS — M5136 Other intervertebral disc degeneration, lumbar region: Secondary | ICD-10-CM

## 2020-05-28 DIAGNOSIS — B1910 Unspecified viral hepatitis B without hepatic coma: Secondary | ICD-10-CM

## 2020-05-28 DIAGNOSIS — M2550 Pain in unspecified joint: Secondary | ICD-10-CM

## 2020-05-28 DIAGNOSIS — I499 Cardiac arrhythmia, unspecified: Secondary | ICD-10-CM

## 2020-05-28 DIAGNOSIS — F329 Major depressive disorder, single episode, unspecified: Secondary | ICD-10-CM

## 2020-05-28 MED ORDER — IOHEXOL 300 MG IODINE/ML IV SOLN
2 mL | Freq: Once | 0 refills | Status: CP
Start: 2020-05-28 — End: ?

## 2020-05-28 MED ORDER — METHYLPREDNISOLONE ACETATE 40 MG/ML IJ SUSP
80 mg | Freq: Once | INTRAMUSCULAR | 0 refills | Status: CP
Start: 2020-05-28 — End: ?

## 2020-05-28 MED ORDER — LIDOCAINE (PF) 10 MG/ML (1 %) IJ SOLN
2 mL | Freq: Once | INTRAMUSCULAR | 0 refills | Status: CP
Start: 2020-05-28 — End: ?

## 2020-05-28 NOTE — Discharge Instructions - Supplementary Instructions
GENERAL POST PROCEDURE INSTRUCTIONS  Physician: _________________________________  Procedure Completed Today:  o Joint Injection (hip, knee, shoulder)  o Cervical Epidural Steroid Injection  o Cervical Transforaminal Steroid Injection  o Trigger Point Injection  o Caudal Epidural Steroid Injection  o Pudendal Nerve Block  o Other _____________________ o Thoracic Epidural Steroid Injection  o Lumbar Epidural Steroid Injection  o Lumbar Transforaminal Steroid Injection  o Facet Joint Injection  o Celiac Nerve Block  o Sacrococcygeal  o Sacroiliac Joint Injection   Important information following your procedure today:  o You may drive today     o If you had sedation, you may NOT drive today  - Rest at home for the next 6 hours.  You may then begin to resume your normal activities.  - DO NOT drive any vehicle, operate any power tools, drink alcohol, make any major decisions, or sign any legal documents for the next 12 hours.  1. Pain relief may not be immediate. It is possible you may even experience an increase in pain during the first 24-48 hours followed by a gradual decrease of your pain.  2. Though the procedure is generally safe, and complications are rare, we do ask that you be aware of any of the following:  ? Any swelling, persistent redness, new bleeding or drainage from the site of the injection.  ? You should not experience a severe headache.  ? You should not run a fever over 101oF.  ? New onset of sharp, severe back and or neck pain.  ? New onset of upper or lower extremity numbness or weakness.  ? New difficulty controlling bowel or bladder function after injection.  ? New shortness of breath.  ** If any of these occur, please call to report this occurrence to a nurse at [phone]. If you are calling after 4:00 p.m. or on weekends or holidays, please call [phone] and ask to have the resident physician on call for the physician paged or go to your local emergency room.  3. You may experience soreness at the injection site. Ice can be applied at 20-minute intervals for the first 24 hours. The following day you may alternate ice with heat if you are experiencing muscle tightness, otherwise continue with ice. Ice works best at decreasing pain. Avoid application of direct heat, hot showers or hot tubs today.  4. Avoid strenuous activity today. You many resume your regular activities and exercise tomorrow.  5. Patients with diabetes may see an elevation in blood sugars for 7-10 days after the injection. It is important to pay close attention to your diet, check your blood sugars daily and report extreme elevations to the physician that manages your diabetes.  6. Patients taking daily blood thinners can resume their regular dose this evening.  7. It is important that you take all medications ordered by your pain physician. Taking medications as ordered is an important part of your pain care plan. If you cannot continue the medication plan, please notify the physician.    Possible side effects to steroids that may occur:  ? Flushing or redness of the face  ? Irritability  ? Fluid retention  ? Change in women's menses  ? Minor headache    If you are unable to keep your upcoming appointment, please notify the [facility] scheduler at [phone] at least 24 hours in advance. If you have questions for the surgery center, call [facility] at [phone].

## 2020-05-28 NOTE — Procedures
Attending Surgeon: [name], MD    Anesthesia: Local    Pre-Procedure Diagnosis:   1. 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        Selective Nerve Rootblock/Transforaminal Lumbar/Sacral  Procedure: transforaminal epidural    Laterality: bilateral   on 05/28/2020 11:24 AM  Location: lumbar - L4-5      Consent:   Consent obtained: verbal and written  Consent given by: patient  Risks discussed: allergic reaction, bleeding, bruising, infection, nerve damage, no change or worsening in pain, weakness, swelling and reaction to medication  Alternatives discussed: alternative treatment and delayed treatment  Discussed with patient the purpose of the treatment/procedure, other ways of treating my condition, including no treatment/ procedure and the risks and benefits of the alternatives. Patient has decided to proceed with treatment/procedure.        Universal Protocol:  Relevant documents: relevant documents present and verified  Test results: test results available and properly labeled  Imaging studies: imaging studies available  Required items: required blood products, implants, devices, and special equipment available  Site marked: the operative site was marked  Patient identity confirmed: Patient identify confirmed verbally with patient.        Time out: Immediately prior to procedure a time out was called to verify the correct patient, procedure, equipment, support staff and site/side marked as required      Procedures Details:   Indications: pain   Prep: chlorhexidine  Patient position: prone  Estimated Blood Loss: minimal  Specimens: none  Amount Injected:   L4-5: 2mL  Number of Joints: 1  Approach: paramedian  Guidance: fluoroscopy  Contrast: Procedure confirmed with contrast under live fluoroscopy.  Needle size: 25 G  Injection procedure: Incremental injection and Negative aspiration for blood  Patient tolerance: Patient tolerated the procedure well with no immediate complications. Pressure was applied, and hemostasis was accomplished.  Outcome: Pain unchanged  Comments: 40mg  of depomedrol and 1ml of normal saline injected at each level        Estimated blood loss: none or minimal  Specimens: none  Patient tolerated the procedure well with no immediate complications. Pressure was applied, and hemostasis was accomplished.

## 2020-05-28 NOTE — Progress Notes
SPINE CENTER  INTERVENTIONAL PAIN PROCEDURE HISTORY AND PHYSICAL    No chief complaint on file.      HISTORY OF PRESENT ILLNESS:    Chronic back pain   Has nevro stim implant that he believes is helping with about 20% pain relief  Has ITP pain pump with no side effects from the ITP morphine. States 20% decrease in pain is extent of relief.   Patient has been adjusting ITP morphine dose over the past few months.     Medical History:   Diagnosis Date   ? Anxiety long ago   ? Arrhythmia    ? COVID-19 12/07/2019   ? Degenerative disc disease, lumbar    ? Degenerative disc disease, thoracic    ? Depression with pain   ? Essential hypertension    ? Fibromyalgia    ? Generalized headaches     neck surgery   ? Hepatitis B unknown   ? Joint pain years   ? Kidney stones    ? 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 REVISION  SPINAL NEUROSTIMULATOR PULSE GENERATOR/ RECEIVER N/A 07/25/2017    Performed by [name], MD at [facility] OR   ? IMPLANTATION INTRATHECAL PUMP IMPLANT N/A 10/17/2017    Performed by [name], MD at [facility] OR   ? BACK SURGERY     ? HERNIA REPAIR     ? HX FUSION PROCEDURE     ? HX JOINT REPLACEMENT      total right, uni left   ? KNEE SURGERY     ? NECK SURGERY     ? PENIS SURGERY     ? PR LAPAROSCOPY SURG RPR INITIAL INGUINAL HERNIA     ? PROSTATECTOMY     ? STIMULATOR IMPLANT         family history is not on file.    Social History     Socioeconomic History   ? Marital status: Married     Spouse name: Not on file   ? Number of children: Not on file   ? Years of education: Not on file   ? Highest education level: Not on file   Occupational History   ? Not on file   Tobacco Use   ? Smoking status: Never Smoker   ? Smokeless tobacco: Never Used   Substance and Sexual Activity   ? Alcohol use: Yes     Alcohol/week: 11.0 - 14.0 standard drinks     Types: 8 - 10 Cans of beer, 3 - 4 Shots of liquor per week   ? Drug use: Never   ? Sexual activity: Not Currently     Partners: Female   Other Topics Concern   ? Not on file   Social History Narrative   ? Not on file       No Known Allergies    Vitals:    05/28/20 1054   BP: 119/64   Temp: 36.7 ?C (98.1 ?F)   SpO2: 100%   Weight: 90.7 kg (200 lb)   Height: 177.8 cm (70)       REVIEW OF SYSTEMS: 10 point ROS obtained and negative except per HPI      PHYSICAL EXAM:  Gen: Alert x 3  Chest: CTAB  Neck:Supple  Psych: Normal mood and affect  Skin: no rashes or lesions  Neuro: Grossly intact  Musc:  Tenderness in L-spine        IMPRESSION:    1. 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

## 2020-07-07 ENCOUNTER — Encounter: Admit: 2020-07-07 | Discharge: 2020-07-07 | Payer: MEDICARE

## 2020-07-07 NOTE — Telephone Encounter
MA attempted to contact pt mulitple times regarding his telehealth appointment he has with Dr Janyth Contes was unable to reach pt VM was left also contacted pt on [phone] no answer.

## 2020-08-20 ENCOUNTER — Encounter: Admit: 2020-08-20 | Discharge: 2020-08-20 | Payer: MEDICARE

## 2020-08-20 NOTE — Telephone Encounter
Received VM from Mr. Tauzin stating that he needs to make an appointment for his ITP to be refilled. Routing to [name]    Trudie Buckler, RN

## 2020-09-25 ENCOUNTER — Encounter: Admit: 2020-09-25 | Discharge: 2020-09-25 | Payer: MEDICARE

## 2020-09-25 ENCOUNTER — Ambulatory Visit: Admit: 2020-09-25 | Discharge: 2020-09-25 | Payer: MEDICARE

## 2020-09-25 MED ORDER — MORPHINE 9.6MG/ML + BUPIVACAINE 4.6MG/ML IT PAIN PUMP SYR
Freq: Once | INTRATHECAL | 0 refills | Status: CP
Start: 2020-09-25 — End: ?
  Administered 2020-09-25 (×3): 20.000 mL via INTRATHECAL

## 2020-10-13 ENCOUNTER — Encounter: Admit: 2020-10-13 | Discharge: 2020-10-13 | Payer: MEDICARE

## 2020-10-15 ENCOUNTER — Encounter: Admit: 2020-10-15 | Discharge: 2020-10-15 | Payer: MEDICARE

## 2020-10-15 ENCOUNTER — Ambulatory Visit: Admit: 2020-10-15 | Discharge: 2020-10-15 | Payer: MEDICARE

## 2020-10-15 DIAGNOSIS — B1910 Unspecified viral hepatitis B without hepatic coma: Secondary | ICD-10-CM

## 2020-10-15 DIAGNOSIS — M797 Fibromyalgia: Secondary | ICD-10-CM

## 2020-10-15 DIAGNOSIS — I10 Essential (primary) hypertension: Secondary | ICD-10-CM

## 2020-10-15 DIAGNOSIS — M545 Chronic midline low back pain without sciatica: Secondary | ICD-10-CM

## 2020-10-15 DIAGNOSIS — F32A Depression: Secondary | ICD-10-CM

## 2020-10-15 DIAGNOSIS — F419 Anxiety disorder, unspecified: Secondary | ICD-10-CM

## 2020-10-15 DIAGNOSIS — M47816 Spondylosis without myelopathy or radiculopathy, lumbar region: Secondary | ICD-10-CM

## 2020-10-15 DIAGNOSIS — N200 Calculus of kidney: Secondary | ICD-10-CM

## 2020-10-15 DIAGNOSIS — I499 Cardiac arrhythmia, unspecified: Secondary | ICD-10-CM

## 2020-10-15 DIAGNOSIS — M961 Postlaminectomy syndrome, not elsewhere classified: Secondary | ICD-10-CM

## 2020-10-15 DIAGNOSIS — Z451 Encounter for adjustment and management of infusion pump: Secondary | ICD-10-CM

## 2020-10-15 DIAGNOSIS — C801 Malignant (primary) neoplasm, unspecified: Secondary | ICD-10-CM

## 2020-10-15 DIAGNOSIS — R519 Generalized headaches: Secondary | ICD-10-CM

## 2020-10-15 DIAGNOSIS — M5136 Other intervertebral disc degeneration, lumbar region: Secondary | ICD-10-CM

## 2020-10-15 DIAGNOSIS — M2550 Pain in unspecified joint: Secondary | ICD-10-CM

## 2020-10-15 DIAGNOSIS — M5134 Other intervertebral disc degeneration, thoracic region: Secondary | ICD-10-CM

## 2020-10-15 DIAGNOSIS — U071 COVID-19: Secondary | ICD-10-CM

## 2020-10-15 NOTE — Procedures
ANESTHESIA PROCEDURE REPORT    Intrathecal Pump Analysis & Reprogramming    Date of Service: 10/15/2020    Procedure Title(s):    1. Electronic analysis & reprogramming of infusion pump    Attending Surgeon: Blanchard Kelch Terrilee Dudzik, APRN-NP    Pre-Procedure Diagnosis:   1. Adjustment and management of infusion pump    2. Chronic midline low back pain without sciatica    3. Lumbar spondylosis        Post-Procedure Diagnosis:   1. Adjustment and management of infusion pump    2. Chronic midline low back pain without sciatica    3. Lumbar spondylosis        Indications: Kristopher Richard is a 74 y.o. male with above diagnosis. The patient is here today for reprogramming of an implanted intrathecal infusion pump. The patient's history and physical exam were reviewed. The risks, benefits and alternatives to the procedure were discussed, and all questions were answered to the patient's satisfaction.     Procedure in Detail: The patient was in a seated position in a chair. Interrogation was performed and revealed the following settings:    Drug/Concentration: Bupivacaine 4.6 mg/mL  and Morphine 9.6 mg/mL Reservoir Capacity: 20 mL  Infusion Mode/Rate: periodic flow - total 1.0mg  daily   Patient Controlled Bolus: 0.2 mg per dose every 5 hours up to 5 max per 24 hours     After reprogramming, the following settings were noted:    Infusion Mode/Rate: periodic 1.5 mg per day  Patient Controlled Bolus: 0.25 mg per dose every 4 hours up to 6 max per 24 hours   Low Reservoir Alarm Date: feb 14 21     Disposition: The patient tolerated the procedure well, and there were no apparent complications.     Estimated Blood Loss: none    Complications: none

## 2020-10-15 NOTE — Progress Notes
[facility] CLINIC NOTE       SUBJECTIVE:   Chronic back pain   Intractable pain   Also cont to to have severe diaphoresis   Thinks this could be med related   Was off his duloxetine and valsartan for about 2 weeks   He saw his PCP and decided he would stop the duloxetine because he did feel the sweats were improved     At last visit it was noted he had been dry from his pump management for 2 weeks prior to refill  He didn't seem to notice much change from missing the drug   Had no adverse issues or withdrawal that he noticed   He is curious about this and feels he probably isnt getting enough medication or not the right medication     He does continue to function and works through the pain     TRX   nevro SCS- did have this off for some time, feels it minimally seems to help  ITP with no significant relief   Had fentanyl and prialt in past with no relief   Has not tried dilaudid       Review of Systems   Constitutional: Negative.    HENT: Negative.    Eyes: Negative.    Respiratory: Negative.    Cardiovascular: Negative.    Gastrointestinal: Negative.    Endocrine: Negative.    Genitourinary: Negative.    Musculoskeletal: Negative.    Skin: Negative.    Allergic/Immunologic: Negative.    Neurological: Negative.    Hematological: Negative.    Psychiatric/Behavioral: Negative.        Current Outpatient Medications:   ?  amLODIPine (NORVASC) 5 mg tablet, Take 5 mg by mouth daily., Disp: , Rfl:   ?  doxazosin (CARDURA) 2 mg tablet, Take 4 mg by mouth at bedtime daily., Disp: , Rfl:   ?  EPINEPHrine (EPIPEN) 1 mg/mL injection pen (2-Pack), Inject 0.3 mg (1 Pen) into thigh if needed for anaphylactic reaction. May repeat in 5-15 minutes if needed.  Indications: a significant type of allergic reaction called anaphylaxis, Disp: 2 each, Rfl: 0  ?  fluticasone propionate (FLONASE) 50 mcg/actuation nasal spray, suspension, Apply 50 sprays into nose as directed as Needed., Disp: , Rfl:   ?  hydroCHLOROthiazide (HYDRODIURIL) 25 mg tablet, Take 25 mg by mouth every morning., Disp: , Rfl:   ?  metoprolol tartrate (LOPRESSOR) 100 mg tablet, Take 100 mg by mouth twice daily., Disp: , Rfl:   ?  morphine  PF (INFUMORPH) 25 mg/mL 9.6 mg/mL, bupivacaine PF (MARCAINE) 0.75 % 4.6 mg/mL in sodium chloride PF 0.9% 0.9 % 20 mL intrathecal infusion syr, by Intrathecal route Continuous. Managed by Dr Janyth Contes and Z. Kentrel Clevenger APRN in [facility], Disp: , Rfl:   ?  multivit-min-FA-lycopen-lutein (CENTRUM SILVER MEN) 300-600-300 mcg tab, Take 1 tablet by mouth daily., Disp: , Rfl:   ?  senna/docusate (SENOKOT-S) 8.6/50 mg tablet, Take 1 tablet by mouth twice daily., Disp: , Rfl:   ?  temazepam (RESTORIL) 15 mg capsule, Take 15 mg by mouth at bedtime as needed., Disp: , Rfl:   ?  valsartan (DIOVAN) 160 mg tablet, Take 160 mg by mouth daily., Disp: , Rfl:   ?  warfarin sodium (WARFARIN PO), Take  by mouth., Disp: , Rfl:   No Known Allergies  Physical Exam  Vitals:    10/15/20 1542   BP: (!) 161/82   BP Source: Arm, Left Upper  Patient Position: Sitting   Pulse: 54   SpO2: 97%   Weight: 87.1 kg (192 lb)   Height: 177.8 cm (70)   PainSc: Six        Pain Score: Six  Body mass index is 27.55 kg/m?Marland Kitchen    General: Alert, oriented, mild distress  HEENT: normocephalic  NECK: TTP along c spine and paracerv muscles. Limited ROM  Resp: Non labored breathing, no distress  Cardio: Pedal pulses palpable bilaterally, mild lower extremity edema  MS: TTP in lumbar region of spine and paraspinal muscles.  NEURO: Cranial nerves II-XII intact. Motor strength 5+ throughout, sensory exams normal.  Gait antalgic.   Behavior: Calm, cooperative, behavior and speech normal.         IMPRESSION:  1. Adjustment and management of infusion pump    2. Chronic midline low back pain without sciatica    3. Lumbar spondylosis        PLAN:    itp increase from 1mg  to 1.5mg  periodic flow   im not sure he will have significant response to this   Discussed weaning off this   May consider dilaudid in future if tolerated   Will plan 2 week telehealth   Will discuss with Dr Janyth Contes   Pt agreeable to plan   Keep off Cymbalta for now

## 2021-01-02 ENCOUNTER — Encounter: Admit: 2021-01-02 | Discharge: 2021-01-02 | Payer: MEDICARE

## 2021-01-02 DIAGNOSIS — Z451 Encounter for adjustment and management of infusion pump: Secondary | ICD-10-CM

## 2021-01-06 ENCOUNTER — Ambulatory Visit: Admit: 2021-01-06 | Discharge: 2021-01-06 | Payer: MEDICARE

## 2021-01-06 ENCOUNTER — Encounter: Admit: 2021-01-06 | Discharge: 2021-01-06 | Payer: MEDICARE

## 2021-01-06 DIAGNOSIS — N200 Calculus of kidney: Secondary | ICD-10-CM

## 2021-01-06 DIAGNOSIS — M2550 Pain in unspecified joint: Secondary | ICD-10-CM

## 2021-01-06 DIAGNOSIS — R519 Generalized headaches: Secondary | ICD-10-CM

## 2021-01-06 DIAGNOSIS — M47816 Spondylosis without myelopathy or radiculopathy, lumbar region: Secondary | ICD-10-CM

## 2021-01-06 DIAGNOSIS — M545 Chronic midline low back pain without sciatica: Secondary | ICD-10-CM

## 2021-01-06 DIAGNOSIS — M797 Fibromyalgia: Secondary | ICD-10-CM

## 2021-01-06 DIAGNOSIS — Z978 Presence of other specified devices: Secondary | ICD-10-CM

## 2021-01-06 DIAGNOSIS — I10 Essential (primary) hypertension: Secondary | ICD-10-CM

## 2021-01-06 DIAGNOSIS — M5136 Other intervertebral disc degeneration, lumbar region: Secondary | ICD-10-CM

## 2021-01-06 DIAGNOSIS — C801 Malignant (primary) neoplasm, unspecified: Secondary | ICD-10-CM

## 2021-01-06 DIAGNOSIS — I499 Cardiac arrhythmia, unspecified: Secondary | ICD-10-CM

## 2021-01-06 DIAGNOSIS — U071 COVID-19: Secondary | ICD-10-CM

## 2021-01-06 DIAGNOSIS — F32A Depression: Secondary | ICD-10-CM

## 2021-01-06 DIAGNOSIS — M5134 Other intervertebral disc degeneration, thoracic region: Secondary | ICD-10-CM

## 2021-01-06 DIAGNOSIS — F419 Anxiety disorder, unspecified: Secondary | ICD-10-CM

## 2021-01-06 DIAGNOSIS — B1910 Unspecified viral hepatitis B without hepatic coma: Secondary | ICD-10-CM

## 2021-01-06 MED ORDER — INTRATHECAL PAIN PUMP SYR- NON UKH PHARMACY
1 | Freq: Once | INTRATHECAL | 0 refills | Status: CP
Start: 2021-01-06 — End: ?
  Administered 2021-01-06: 20:00:00 1 via INTRATHECAL

## 2021-01-06 MED ORDER — KETOROLAC 30 MG/ML (1 ML) IJ SOLN
30 mg | Freq: Once | INTRAVENOUS | 0 refills | Status: CP
Start: 2021-01-06 — End: ?
  Administered 2021-01-06: 20:00:00 30 mg via INTRAVENOUS

## 2021-01-06 NOTE — Procedures
INTERVENTIONAL PAIN MANAGEMENT PROCEDURE REPORT    Intrathecal Pump Refill & Reprogramming    Date of Service: 01/06/2021    Procedure Title(s):    1. Electronic analysis, reprogramming & refill of infusion pump   2. Refill & maintenance of infusion pump    Attending Surgeon: Thayer Dallas Idamay Hosein, APRN-NP     Pre-Procedure Diagnosis:   1. Lumbar spondylosis    2. Chronic midline low back pain without sciatica    3. Presence of intrathecal pump        Post-Procedure Diagnosis:   1. Lumbar spondylosis    2. Chronic midline low back pain without sciatica    3. Presence of intrathecal pump        Indications: Kristopher Richard is a 75 y.o. male with above diagnosis. The patient is here today for refill and reprogramming of an implanted intrathecal infusion pump. The patient's history and physical exam were reviewed. The risks, benefits and alternatives to the procedure were discussed, and all questions were answered to the patient's satisfaction. The patient agreed to proceed, and written informed consent was obtained.     Procedure in Detail: The patient was brought into the exam room and was in a supine position on the exam table. Interrogation was performed and revealed the following settings:    Drug/Concentration: Bupivacaine 4.6 mg/mL  and Morphine 9.6 mg/mL  Infusion Mode/Rate: Periodic Flow, basal 0mg  /day, total daily 1.5mg   Periodic pump Controlled Bolus: 0.25 mg per dose every 4 hours up to 6 max per 24 hours   Reservoir Volume: 5 mL    The pump site was then prepped with chloroprep and draped in a sterile manner. The refill template was placed over the pump and the refill port was located. A 22-gauge Huber needle with tubing (clamped) was advanced into the refill port. A 20 mL syringe was attached and aspiration was steady until 5 mL were withdrawn and bubbles were noted. The tubing was re-clamped and a new syringe containing 20 mL of Bupivacaine 4.6 mg/mL  and Morphine 9.6 mg/mL was attached. The tubing was unclamped and the pump was filled easily without overpressure. The needle was removed; the area was cleaned, and a bandage was placed over the needle insertion site.    After reprogramming, the following settings were noted:    Drug/Concentration: Bupivacaine 4.6 mg/mL  and Morphine 9.6 mg/mL  Infusion Mode/Rate: Periodic flow basal at 0 mg per day, total daily 1.68mg   Periodic pump Controlled Bolus: 0.28 mg per dose every 4 hours up to 6 max per 24 hours     Reservoir Volume: 19.3 mL  Low Reservoir Alarm Date: 04/09/21    Disposition: The patient tolerated the procedure well, and there were no apparent complications.     Estimated Blood Loss: none    Complications: none

## 2021-03-31 ENCOUNTER — Encounter: Admit: 2021-03-31 | Discharge: 2021-03-31 | Payer: MEDICARE

## 2021-03-31 ENCOUNTER — Ambulatory Visit: Admit: 2021-03-31 | Discharge: 2021-03-31 | Payer: MEDICARE

## 2021-03-31 ENCOUNTER — Ambulatory Visit: Admit: 2021-03-31 | Discharge: 2021-04-01 | Payer: MEDICARE

## 2021-03-31 DIAGNOSIS — M797 Fibromyalgia: Secondary | ICD-10-CM

## 2021-03-31 DIAGNOSIS — F419 Anxiety disorder, unspecified: Secondary | ICD-10-CM

## 2021-03-31 DIAGNOSIS — R519 Generalized headaches: Secondary | ICD-10-CM

## 2021-03-31 DIAGNOSIS — N200 Calculus of kidney: Secondary | ICD-10-CM

## 2021-03-31 DIAGNOSIS — F32A Depression: Secondary | ICD-10-CM

## 2021-03-31 DIAGNOSIS — M5134 Other intervertebral disc degeneration, thoracic region: Secondary | ICD-10-CM

## 2021-03-31 DIAGNOSIS — I499 Cardiac arrhythmia, unspecified: Secondary | ICD-10-CM

## 2021-03-31 DIAGNOSIS — C801 Malignant (primary) neoplasm, unspecified: Secondary | ICD-10-CM

## 2021-03-31 DIAGNOSIS — I10 Essential (primary) hypertension: Secondary | ICD-10-CM

## 2021-03-31 DIAGNOSIS — M2550 Pain in unspecified joint: Secondary | ICD-10-CM

## 2021-03-31 DIAGNOSIS — B1910 Unspecified viral hepatitis B without hepatic coma: Secondary | ICD-10-CM

## 2021-03-31 DIAGNOSIS — M5136 Other intervertebral disc degeneration, lumbar region: Secondary | ICD-10-CM

## 2021-03-31 DIAGNOSIS — U071 COVID-19: Secondary | ICD-10-CM

## 2021-03-31 MED ORDER — MORPHINE 9.6MG/ML + BUPIVACAINE 4.6MG/ML IT PAIN PUMP SYR
Freq: Once | INTRATHECAL | 0 refills | Status: CP
Start: 2021-03-31 — End: ?
  Administered 2021-03-31 (×3): 20.000 mL via INTRATHECAL

## 2021-03-31 MED ORDER — MORPHINE 9.6MG/ML + BUPIVACAINE 4.6MG/ML IT PAIN PUMP SYR
Freq: Once | INTRATHECAL | 0 refills | Status: AC
Start: 2021-03-31 — End: ?

## 2021-03-31 NOTE — Progress Notes
Assessment and Plan:    INTERVENTIONAL PAIN MANAGEMENT PROCEDURE REPORT    Intrathecal Pump Refill & Reprogramming    Date of Service: 03/31/2021    Procedure Title(s):    1. Electronic analysis, reprogramming & refill of infusion pump   2. Refill & maintenance of infusion pump    Attending Surgeon: [facility] ANES PAIN NURSE     Pre-Procedure Diagnosis: No diagnosis found.    Post-Procedure Diagnosis: No diagnosis found.    Indications: Kristopher Richard is a 75 y.o. male with above diagnosis. The patient is here today for refill and reprogramming of an implanted intrathecal infusion pump. The patient's history and physical exam were reviewed. The risks, benefits and alternatives to the procedure were discussed, and all questions were answered to the patient's satisfaction. The patient agreed to proceed, and written informed consent was obtained.     Procedure in Detail: The patient was brought into the exam room and was in a supine position on the exam table. Interrogation was performed and revealed the following settings:    Drug/Concentration: Morphine 9.6 mg/mL Bupivicaine 4.6mg /ml  Infusion Mode/Rate: Periodic flow basal at 0mg  per day, total daily 1.680mg  per day.  Periodic pump Controlled Bolus: 0.28 mg per dose every 4 hours up to 6 max per 24 hours   Reservoir Volume: 4.997 mL    The pump site was then prepped with chloroprep and draped in a sterile manner. The refill template was placed over the pump and the refill port was located. A 22-gauge Huber needle with tubing (clamped) was advanced into the refill port. A 20 mL syringe was attached and aspiration was steady until 5.0 mL were withdrawn and bubbles were noted. The tubing was re-clamped and a new syringe containing 20 mL of Bupivacaine 4.6 mg/mL Morphine 9.6mg /ml was attached. The tubing was unclamped and the pump was filled easily without overpressure. The needle was removed; the area was cleaned, and a bandage was placed over the needle insertion site.    After reprogramming, the following settings were noted:    Drug/Concentration: Morphine 9.6 mg/mL, Bupivacaine 4.6mg /ml   Infusion Mode/Rate: Periodic flow basal at 0 mg per day, total daily 1.68mg   Periodic pump Controlled Bolus: 0.28mg  per dose every 4 hours up to 6 max per 24 hours     Reservoir Volume: 19.995 mL  Low Reservoir Alarm Date: 07/06/2021    Disposition: The patient tolerated the procedure well, and there were no apparent complications.     Estimated Blood Loss: none    Complications: none

## 2021-06-29 ENCOUNTER — Encounter: Admit: 2021-06-29 | Discharge: 2021-06-29 | Payer: MEDICARE

## 2021-06-29 DIAGNOSIS — Z451 Encounter for adjustment and management of infusion pump: Secondary | ICD-10-CM

## 2021-06-30 ENCOUNTER — Encounter: Admit: 2021-06-30 | Discharge: 2021-06-30 | Payer: MEDICARE

## 2021-06-30 ENCOUNTER — Ambulatory Visit: Admit: 2021-06-30 | Discharge: 2021-06-30 | Payer: MEDICARE

## 2021-06-30 DIAGNOSIS — R519 Generalized headaches: Secondary | ICD-10-CM

## 2021-06-30 DIAGNOSIS — U071 COVID-19: Secondary | ICD-10-CM

## 2021-06-30 DIAGNOSIS — F419 Anxiety disorder, unspecified: Secondary | ICD-10-CM

## 2021-06-30 DIAGNOSIS — M797 Fibromyalgia: Secondary | ICD-10-CM

## 2021-06-30 DIAGNOSIS — M961 Postlaminectomy syndrome, not elsewhere classified: Secondary | ICD-10-CM

## 2021-06-30 DIAGNOSIS — C801 Malignant (primary) neoplasm, unspecified: Secondary | ICD-10-CM

## 2021-06-30 DIAGNOSIS — Z451 Encounter for adjustment and management of infusion pump: Secondary | ICD-10-CM

## 2021-06-30 DIAGNOSIS — M5134 Other intervertebral disc degeneration, thoracic region: Secondary | ICD-10-CM

## 2021-06-30 DIAGNOSIS — M47816 Spondylosis without myelopathy or radiculopathy, lumbar region: Secondary | ICD-10-CM

## 2021-06-30 DIAGNOSIS — M5136 Other intervertebral disc degeneration, lumbar region: Secondary | ICD-10-CM

## 2021-06-30 DIAGNOSIS — B1910 Unspecified viral hepatitis B without hepatic coma: Secondary | ICD-10-CM

## 2021-06-30 DIAGNOSIS — M2550 Pain in unspecified joint: Secondary | ICD-10-CM

## 2021-06-30 DIAGNOSIS — I499 Cardiac arrhythmia, unspecified: Secondary | ICD-10-CM

## 2021-06-30 DIAGNOSIS — N200 Calculus of kidney: Secondary | ICD-10-CM

## 2021-06-30 DIAGNOSIS — F32A Depression: Secondary | ICD-10-CM

## 2021-06-30 DIAGNOSIS — M545 Chronic midline low back pain without sciatica: Secondary | ICD-10-CM

## 2021-06-30 DIAGNOSIS — I10 Essential (primary) hypertension: Secondary | ICD-10-CM

## 2021-06-30 MED ORDER — INTRATHECAL PAIN PUMP SYR- NON UKH PHARMACY
1 | Freq: Once | INTRATHECAL | 0 refills | Status: CP
Start: 2021-06-30 — End: ?
  Administered 2021-06-30: 18:00:00 1 via INTRATHECAL

## 2021-06-30 NOTE — Procedures
INTERVENTIONAL PAIN MANAGEMENT PROCEDURE REPORT    Intrathecal Pump Refill & Reprogramming    Date of Service: 06/30/2021    Procedure Title(s):    1. Electronic analysis, reprogramming & refill of infusion pump   2. Refill & maintenance of infusion pump    Attending Surgeon: Thayer Dallas Aniya Jolicoeur, APRN-NP     Pre-Procedure Diagnosis:   1. Adjustment and management of infusion pump    2. Lumbar spondylosis    3. Chronic midline low back pain without sciatica    4. Postlaminectomy syndrome, lumbar region        Post-Procedure Diagnosis:   1. Adjustment and management of infusion pump    2. Lumbar spondylosis    3. Chronic midline low back pain without sciatica    4. Postlaminectomy syndrome, lumbar region        Indications: Kristopher Richard is a 75 y.o. male with above diagnosis. The patient is here today for refill and reprogramming of an implanted intrathecal infusion pump. The patient's history and physical exam were reviewed. The risks, benefits and alternatives to the procedure were discussed, and all questions were answered to the patient's satisfaction. The patient agreed to proceed, and written informed consent was obtained.     Procedure in Detail: The patient was brought into the exam room and was in a supine position on the exam table. Interrogation was performed and revealed the following settings:    Drug/Concentration: Bupivacaine 4.6 mg/mL  and Morphine 9.6 mg/mL  Infusion Mode/Rate: periodic basal at 0 mg , total daily 1.68mg /day  Periodic Controlled Bolus: 0.28 mg per dose every 4 hours up to 6 max per 24 hours   Reservoir Volume: 4 mL    The pump site was then prepped with chloroprep and draped in a sterile manner. The refill template was placed over the pump and the refill port was located. A 22-gauge Huber needle with tubing (clamped) was advanced into the refill port. A 20 mL syringe was attached and aspiration was steady until 4 mL were withdrawn and bubbles were noted. The tubing was re-clamped and a new syringe containing 20 mL of Bupivacaine 4.6 mg/mL  and Morphine 9.6 mg/mL was attached. The tubing was unclamped and the pump was filled easily without overpressure. The needle was removed; the area was cleaned, and a bandage was placed over the needle insertion site.    After reprogramming, the following settings were noted:    Drug/Concentration: Bupivacaine 4.6 mg/mL  and Morphine 9.6 mg/mL  Infusion Mode/Rate: periodic basal 0mg  with total daily at 1.74 mg per day  Patient Controlled Bolus: 0.29 mg per dose every 4 hours up to 6 max per 24 hours   Reservoir Volume: 19 mL  Low Reservoir Alarm Date: 09/27/21    Disposition: The patient tolerated the procedure well, and there were no apparent complications.     Estimated Blood Loss: none    Complications: none

## 2021-06-30 NOTE — Progress Notes
[facility]IN[facility] NOTE       SUBJECTIVE:   Kristopher Richard is a 75 y.o. 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    Chronic back pain   Last seen in may   Pain is stable for the most part   Recently finds he Takes a lot of acetaminophen to supplement pain relief   Has ITP morphine   Is on periodic flow mode   Finds this helps  Comes in for refill and management of ITP         Review of Systems   Constitutional: Positive for diaphoresis and fatigue. Negative for fever and unexpected weight change.   HENT: Negative.    Eyes: Negative.    Respiratory: Positive for apnea.    Cardiovascular: Negative.    Gastrointestinal: Negative.    Endocrine: Negative.    Genitourinary: Negative.    Musculoskeletal: Positive for arthralgias, back pain, myalgias, neck pain and neck stiffness.   Skin: Negative.    Allergic/Immunologic: Negative.    Neurological: Positive for light-headedness and headaches.   Hematological: Negative.    Psychiatric/Behavioral: Negative.        Current Outpatient Medications:   ?  amLODIPine (NORVASC) 5 mg tablet, Take 5 mg by mouth daily., Disp: , Rfl:   ?  doxazosin (CARDURA) 2 mg tablet, Take 4 mg by mouth at bedtime daily., Disp: , Rfl:   ?  EPINEPHrine (EPIPEN) 1 mg/mL injection pen (2-Pack), Inject 0.3 mg (1 Pen) into thigh if needed for anaphylactic reaction. May repeat in 5-15 minutes if needed.  Indications: a significant type of allergic reaction called anaphylaxis, Disp: 2 each, Rfl: 0  ?  fluticasone propionate (FLONASE) 50 mcg/actuation nasal spray, suspension, Apply 50 sprays into nose as directed as Needed., Disp: , Rfl:   ?  hydroCHLOROthiazide (HYDRODIURIL) 25 mg tablet, Take 25 mg by mouth every morning., Disp: , Rfl:   ?  metoprolol tartrate (LOPRESSOR) 100 mg tablet, Take 100 mg by mouth twice daily., Disp: , Rfl:   ?  morphine  PF (INFUMORPH) 25 mg/mL 9.6 mg/mL, bupivacaine PF (MARCAINE) 0.75 % 4.6 mg/mL in sodium chloride PF 0.9% 0.9 % 20 mL intrathecal infusion syr, by Intrathecal route Continuous. Managed by Dr Janyth Contes and [name] APRN in [facility], Disp: , Rfl:   ?  multivit-min-FA-lycopen-lutein 300-600-300 mcg tab, Take 1 tablet by mouth daily., Disp: , Rfl:   ?  senna/docusate (SENOKOT-S) 8.6/50 mg tablet, Take 1 tablet by mouth twice daily., Disp: , Rfl:   ?  temazepam (RESTORIL) 15 mg capsule, Take 15 mg by mouth at bedtime as needed., Disp: , Rfl:   ?  valsartan (DIOVAN) 160 mg tablet, Take 160 mg by mouth daily., Disp: , Rfl:   ?  warfarin sodium (WARFARIN PO), Take  by mouth., Disp: , Rfl:   No Known Allergies  Physical Exam  Vitals:    06/30/21 1257   BP: 115/61   Pulse: 65   SpO2: 97%   PainSc: Six     Oswestry Total Score:: 52  Pain Score: Six  There is no height or weight on file to calculate BMI.    General: Alert, oriented, mild distress  HEENT: normocephalic  NECK: TTP along c spine and  paracerv muscles. Limited ROM  Resp: Non labored breathing, no distress  Cardio: Pedal pulses palpable bilaterally, mild lower extremity edema  MS: TTP in lumbar region of spine and paraspinal muscles.  NEURO: Cranial nerves II-XII intact. Motor strength adequate, sensory exams normal. Gait antlagic.   Behavior: Calm, cooperative, behavior and speech normal.           IMPRESSION:  1. Adjustment and management of infusion pump    2. Lumbar spondylosis    3. Chronic midline low back pain without sciatica    4. Postlaminectomy syndrome, lumbar region          PLAN:    Refill ITP   Slight increase in periodic flow dose   Total daily from 1.68 to 1.74mg  per day   Plan f.u in 3 mo for next refill

## 2021-09-18 ENCOUNTER — Encounter: Admit: 2021-09-18 | Discharge: 2021-09-18 | Payer: MEDICARE

## 2021-09-18 DIAGNOSIS — Z451 Encounter for adjustment and management of infusion pump: Secondary | ICD-10-CM

## 2021-09-23 ENCOUNTER — Encounter: Admit: 2021-09-23 | Discharge: 2021-09-23 | Payer: MEDICARE

## 2021-09-23 ENCOUNTER — Ambulatory Visit: Admit: 2021-09-23 | Discharge: 2021-09-23 | Payer: MEDICARE

## 2021-09-23 DIAGNOSIS — R519 Generalized headaches: Secondary | ICD-10-CM

## 2021-09-23 DIAGNOSIS — Z451 Encounter for adjustment and management of infusion pump: Secondary | ICD-10-CM

## 2021-09-23 DIAGNOSIS — M2550 Pain in unspecified joint: Secondary | ICD-10-CM

## 2021-09-23 DIAGNOSIS — B1910 Unspecified viral hepatitis B without hepatic coma: Secondary | ICD-10-CM

## 2021-09-23 DIAGNOSIS — M5134 Other intervertebral disc degeneration, thoracic region: Secondary | ICD-10-CM

## 2021-09-23 DIAGNOSIS — N200 Calculus of kidney: Secondary | ICD-10-CM

## 2021-09-23 DIAGNOSIS — M797 Fibromyalgia: Secondary | ICD-10-CM

## 2021-09-23 DIAGNOSIS — U071 COVID-19: Secondary | ICD-10-CM

## 2021-09-23 DIAGNOSIS — I10 Essential (primary) hypertension: Secondary | ICD-10-CM

## 2021-09-23 DIAGNOSIS — F32A Depression: Secondary | ICD-10-CM

## 2021-09-23 DIAGNOSIS — F419 Anxiety disorder, unspecified: Secondary | ICD-10-CM

## 2021-09-23 DIAGNOSIS — C801 Malignant (primary) neoplasm, unspecified: Secondary | ICD-10-CM

## 2021-09-23 DIAGNOSIS — M5136 Other intervertebral disc degeneration, lumbar region: Secondary | ICD-10-CM

## 2021-09-23 DIAGNOSIS — I499 Cardiac arrhythmia, unspecified: Secondary | ICD-10-CM

## 2021-09-23 MED ORDER — INTRATHECAL PAIN PUMP SYR- NON UKH PHARMACY
1 | Freq: Once | INTRATHECAL | 0 refills | Status: CP
Start: 2021-09-23 — End: ?
  Administered 2021-09-23: 21:00:00 1 via INTRATHECAL

## 2021-12-10 ENCOUNTER — Encounter: Admit: 2021-12-10 | Discharge: 2021-12-10 | Payer: MEDICARE

## 2021-12-10 DIAGNOSIS — Z451 Encounter for adjustment and management of infusion pump: Secondary | ICD-10-CM

## 2021-12-15 ENCOUNTER — Encounter: Admit: 2021-12-15 | Discharge: 2021-12-15 | Payer: MEDICARE

## 2021-12-15 ENCOUNTER — Ambulatory Visit: Admit: 2021-12-15 | Discharge: 2021-12-15 | Payer: MEDICARE

## 2021-12-15 DIAGNOSIS — M797 Fibromyalgia: Secondary | ICD-10-CM

## 2021-12-15 DIAGNOSIS — I499 Cardiac arrhythmia, unspecified: Secondary | ICD-10-CM

## 2021-12-15 DIAGNOSIS — F32A Depression: Secondary | ICD-10-CM

## 2021-12-15 DIAGNOSIS — M5134 Other intervertebral disc degeneration, thoracic region: Secondary | ICD-10-CM

## 2021-12-15 DIAGNOSIS — M2550 Pain in unspecified joint: Secondary | ICD-10-CM

## 2021-12-15 DIAGNOSIS — U071 COVID-19: Secondary | ICD-10-CM

## 2021-12-15 DIAGNOSIS — F419 Anxiety disorder, unspecified: Secondary | ICD-10-CM

## 2021-12-15 DIAGNOSIS — C801 Malignant (primary) neoplasm, unspecified: Secondary | ICD-10-CM

## 2021-12-15 DIAGNOSIS — B1910 Unspecified viral hepatitis B without hepatic coma: Secondary | ICD-10-CM

## 2021-12-15 DIAGNOSIS — R519 Generalized headaches: Secondary | ICD-10-CM

## 2021-12-15 DIAGNOSIS — I10 Essential (primary) hypertension: Secondary | ICD-10-CM

## 2021-12-15 DIAGNOSIS — M5136 Other intervertebral disc degeneration, lumbar region: Secondary | ICD-10-CM

## 2021-12-15 DIAGNOSIS — N200 Calculus of kidney: Secondary | ICD-10-CM

## 2021-12-15 DIAGNOSIS — Z451 Encounter for adjustment and management of infusion pump: Secondary | ICD-10-CM

## 2021-12-15 MED ORDER — INTRATHECAL PAIN PUMP SYR- NON UKH PHARMACY
1 | Freq: Once | INTRATHECAL | 0 refills | Status: CP
Start: 2021-12-15 — End: ?
  Administered 2021-12-15: 19:00:00 1 via INTRATHECAL

## 2021-12-22 ENCOUNTER — Encounter: Admit: 2021-12-22 | Discharge: 2021-12-22 | Payer: MEDICARE

## 2021-12-22 DIAGNOSIS — M2550 Pain in unspecified joint: Secondary | ICD-10-CM

## 2021-12-22 DIAGNOSIS — M5136 Other intervertebral disc degeneration, lumbar region: Secondary | ICD-10-CM

## 2021-12-22 DIAGNOSIS — R519 Generalized headaches: Secondary | ICD-10-CM

## 2021-12-22 DIAGNOSIS — I499 Cardiac arrhythmia, unspecified: Secondary | ICD-10-CM

## 2021-12-22 DIAGNOSIS — M5134 Other intervertebral disc degeneration, thoracic region: Secondary | ICD-10-CM

## 2021-12-22 DIAGNOSIS — C801 Malignant (primary) neoplasm, unspecified: Secondary | ICD-10-CM

## 2021-12-22 DIAGNOSIS — F419 Anxiety disorder, unspecified: Secondary | ICD-10-CM

## 2021-12-22 DIAGNOSIS — F32A Depression: Secondary | ICD-10-CM

## 2021-12-22 DIAGNOSIS — I10 Essential (primary) hypertension: Secondary | ICD-10-CM

## 2021-12-22 DIAGNOSIS — M797 Fibromyalgia: Secondary | ICD-10-CM

## 2021-12-22 DIAGNOSIS — B1910 Unspecified viral hepatitis B without hepatic coma: Secondary | ICD-10-CM

## 2021-12-22 DIAGNOSIS — N200 Calculus of kidney: Secondary | ICD-10-CM

## 2021-12-22 DIAGNOSIS — U071 COVID-19: Secondary | ICD-10-CM

## 2022-02-23 ENCOUNTER — Encounter: Admit: 2022-02-23 | Discharge: 2022-02-23 | Payer: MEDICARE

## 2022-02-23 ENCOUNTER — Ambulatory Visit: Admit: 2022-02-23 | Discharge: 2022-02-23 | Payer: MEDICARE

## 2022-02-23 DIAGNOSIS — Z451 Encounter for adjustment and management of infusion pump: Secondary | ICD-10-CM

## 2022-02-23 MED ORDER — DULOXETINE 30 MG PO CPDR
ORAL_CAPSULE | ORAL | 3 refills | 60.00000 days | Status: AC
Start: 2022-02-23 — End: ?

## 2022-02-23 MED ORDER — MORPHINE 9.6MG/ML + BUPIVACAINE 4.6MG/ML IT PAIN PUMP SYR
Freq: Once | INTRATHECAL | 0 refills | Status: CP
Start: 2022-02-23 — End: ?
  Administered 2022-02-23 (×3): 20.000 mL via INTRATHECAL

## 2022-03-09 ENCOUNTER — Ambulatory Visit: Admit: 2022-03-09 | Discharge: 2022-03-09 | Payer: MEDICARE

## 2022-03-09 DIAGNOSIS — G8929 Other chronic pain: Secondary | ICD-10-CM

## 2022-03-09 MED ORDER — AMITRIPTYLINE 10 MG PO TAB
10 mg | ORAL_TABLET | Freq: Every evening | ORAL | 1 refills | Status: AC
Start: 2022-03-09 — End: ?

## 2022-03-09 NOTE — Progress Notes
Todays visit took place via phone . Total time 20 minutes.      [facility] CLINIC NOTE       SUBJECTIVE:   Chronic pain   In the last 2 months pain worse   Last seen 2 weeks ago   Tried cymbalta recently but this caused adverse cognitive effects so he stopped   Had ITP reduced at last visit and reports his pain is the same     Generalized pain   Sharp all over, joints and arms and back   Left lower back and flank   Wife present today to confirm his level of pain intensity   ?  Has NEVRO SCS   This is on but he has not changed any settings or programs   Doesn't not feel this is significantly helpful   ?  MEDS   Acetaminophen about 3g a day   Gabapentin    lexapro  ?  Past tried lyrica with side effects   Prialt ITP With hallucinations  Fentanyl ITP without relief  Hydromorphone without relief          Review of Systems    Current Outpatient Medications:   ?  amitriptyline (ELAVIL) 10 mg tablet, Take one tablet by mouth at bedtime daily. Indications: neuropathic pain, Disp: 30 tablet, Rfl: 1  ?  amLODIPine (NORVASC) 5 mg tablet, Take one tablet by mouth daily., Disp: , Rfl:   ?  doxazosin (CARDURA) 2 mg tablet, Take two tablets by mouth at bedtime daily., Disp: , Rfl:   ?  EPINEPHrine (EPIPEN) 1 mg/mL injection pen (2-Pack), Inject 0.3 mg (1 Pen) into thigh if needed for anaphylactic reaction. May repeat in 5-15 minutes if needed.  Indications: a significant type of allergic reaction called anaphylaxis, Disp: 2 each, Rfl: 0  ?  escitalopram oxalate (LEXAPRO) 20 mg tablet, , Disp: , Rfl:   ?  fluticasone propionate (FLONASE) 50 mcg/actuation nasal spray, suspension, Apply fifty sprays into nose as directed as Needed., Disp: , Rfl:   ?  gabapentin (NEURONTIN) 300 mg capsule, TAKE 1 CAPSULE BY MOUTH AT BEDTIME MAY INCREASE TO 2 AT BEDTIME AFTER 2 WEEKS, Disp: , Rfl:   ?  hydroCHLOROthiazide (HYDRODIURIL) 25 mg tablet, Take one tablet by mouth every morning., Disp: , Rfl:   ?  metoprolol tartrate (LOPRESSOR) 100 mg tablet, Take one tablet by mouth twice daily., Disp: , Rfl:   ?  morphine  PF (INFUMORPH) 25 mg/mL 9.6 mg/mL, bupivacaine PF (MARCAINE) 0.75 % 4.6 mg/mL in sodium chloride PF 0.9% 0.9 % 20 mL intrathecal infusion syr, by Intrathecal route Continuous. Managed by Dr Janyth Contes and Z. Makhia Vosler APRN in [facility], Disp: , Rfl:   ?  multivit-min-FA-lycopen-lutein 300-600-300 mcg tab, Take 1 tablet by mouth daily., Disp: , Rfl:   ?  senna/docusate (SENOKOT-S) 8.6/50 mg tablet, Take one tablet by mouth twice daily., Disp: , Rfl:   ?  temazepam (RESTORIL) 15 mg capsule, Take one capsule by mouth at bedtime as needed., Disp: , Rfl:   ?  valsartan (DIOVAN) 160 mg tablet, Take one tablet by mouth daily., Disp: , Rfl:   ?  warfarin sodium (WARFARIN PO), Take  by mouth., Disp: , Rfl:   Allergies   Allergen Reactions   ? Cymbalta [Duloxetine] MENTAL STATUS CHANGES     Physical Exam  There were no vitals filed for this visit.        There is no height or weight on file to calculate BMI.  IMPRESSION:  1. Chronic intractable pain          PLAN:    DC Cymbalta   Consider elavil 10mg  hs   Monitor drowsiness   Plan f.u in 2 weeks

## 2022-05-07 ENCOUNTER — Encounter: Admit: 2022-05-07 | Discharge: 2022-05-07 | Payer: MEDICARE

## 2022-05-07 DIAGNOSIS — Z451 Encounter for adjustment and management of infusion pump: Secondary | ICD-10-CM

## 2022-05-07 NOTE — Telephone Encounter
Patient LVM, RN returned call LVM to schedule pump refill.

## 2022-05-25 ENCOUNTER — Encounter: Admit: 2022-05-25 | Discharge: 2022-05-25 | Payer: MEDICARE

## 2022-05-25 MED ORDER — AMITRIPTYLINE 10 MG PO TAB
ORAL_TABLET | 0 refills
Start: 2022-05-25 — End: ?

## 2022-06-03 ENCOUNTER — Encounter: Admit: 2022-06-03 | Discharge: 2022-06-03 | Payer: MEDICARE

## 2022-06-03 ENCOUNTER — Ambulatory Visit: Admit: 2022-06-03 | Discharge: 2022-06-03 | Payer: MEDICARE

## 2022-06-03 MED ORDER — AMITRIPTYLINE 25 MG PO TAB
25 mg | ORAL_TABLET | Freq: Every evening | ORAL | 3 refills
Start: 2022-06-03 — End: ?

## 2022-06-03 MED ORDER — MORPHINE 9.6MG/ML + BUPIVACAINE 4.6MG/ML IT PAIN PUMP SYR
Freq: Once | INTRATHECAL | 0 refills | Status: CP
Start: 2022-06-03 — End: ?
  Administered 2022-06-03 (×3): 20.000 mL via INTRATHECAL

## 2022-08-06 ENCOUNTER — Encounter: Admit: 2022-08-06 | Discharge: 2022-08-06 | Payer: MEDICARE

## 2022-08-12 ENCOUNTER — Ambulatory Visit: Admit: 2022-08-12 | Discharge: 2022-08-13 | Payer: MEDICARE

## 2022-08-12 ENCOUNTER — Encounter: Admit: 2022-08-12 | Discharge: 2022-08-12 | Payer: MEDICARE

## 2022-08-12 DIAGNOSIS — G8929 Other chronic pain: Secondary | ICD-10-CM

## 2022-08-12 DIAGNOSIS — M961 Postlaminectomy syndrome, not elsewhere classified: Secondary | ICD-10-CM

## 2022-08-12 DIAGNOSIS — Z451 Encounter for adjustment and management of infusion pump: Secondary | ICD-10-CM

## 2022-08-12 MED ORDER — MORPHINE 9.6MG/ML + BUPIVACAINE 4.6MG/ML IT PAIN PUMP SYR
Freq: Once | INTRATHECAL | 0 refills | Status: CP
Start: 2022-08-12 — End: ?
  Administered 2022-08-12 (×3): 20.000 mL via INTRATHECAL

## 2022-08-12 NOTE — Procedures
INTERVENTIONAL PAIN MANAGEMENT PROCEDURE REPORT    Intrathecal Pump Refill/Maintain    Date of Service: 08/12/2022    Procedure Title(s):    1. Electronic analysis & maintenance of infusion pump   2. Refill & maintenance of infusion pump    Attending Surgeon: Thayer Dallas Hussaini, APRN-NP     Pre-Procedure Diagnosis:   1. Adjustment and management of infusion pump        Post-Procedure Diagnosis:   1. Adjustment and management of infusion pump        Indications: Kristopher Richard is a 76 y.o. male with above diagnosis. The patient is here today for refill and maintenance of an implanted intrathecal infusion pump. The patient's history and physical exam were reviewed. The risks, benefits and alternatives to the procedure were discussed, and all questions were answered to the patient's satisfaction. The patient agreed to proceed, and written informed consent was obtained.     Procedure in Detail: The patient was brought into the exam room and was in a supine position on the exam table. Interrogation was performed and revealed the following settings:    Drug/Concentration: Bupivacaine 4.6 mg/mL  and Morphine 9.6 mg/mL  Infusion Mode/Rate: Simple continuous at 1.4 mg per day  Patient Controlled Bolus: 0.16 mg per dose every 4hr 30 min hours up to 5 max per 24 hours   Reservoir Volume: 9.163 mL    The pump site was then prepped with chloroprep and draped in a sterile manner. The refill template was placed over the pump and the refill port was located. A 22-gauge Huber needle with tubing (clamped) was advanced into the refill port. A 20 mL syringe was attached and aspiration was steady until 2 mL were withdrawn and bubbles were noted. The tubing was re-clamped and a new syringe containing 20 mL of Bupivacaine 4.6 mg/mL  and Morphine 9.6 mg/mL was attached. The tubing was unclamped and the pump was filled easily without overpressure. The needle was removed; the area was cleaned, and a bandage was placed over the needle insertion site.    After refill, the following settings were noted:     Reservoir Volume: 19.3 mL  Low Reservoir Alarm Date: 10/22/2022    Disposition: The patient tolerated the procedure well, and there were no apparent complications.     Estimated Blood Loss: none    Complications: none

## 2022-10-13 ENCOUNTER — Encounter: Admit: 2022-10-13 | Discharge: 2022-10-13 | Payer: MEDICARE

## 2022-10-14 ENCOUNTER — Encounter: Admit: 2022-10-14 | Discharge: 2022-10-14 | Payer: MEDICARE

## 2022-10-14 ENCOUNTER — Ambulatory Visit: Admit: 2022-10-14 | Discharge: 2022-10-14 | Payer: MEDICARE

## 2022-10-14 DIAGNOSIS — Z451 Encounter for adjustment and management of infusion pump: Secondary | ICD-10-CM

## 2022-10-14 MED ORDER — BUPRENORPHINE HCL 2 MG SL SUBL
2 mg | ORAL_TABLET | SUBLINGUAL | 0 refills | Status: AC
Start: 2022-10-14 — End: ?

## 2022-10-14 MED ORDER — MORPHINE 9.6MG/ML + BUPIVACAINE 4.6MG/ML IT PAIN PUMP SYR
Freq: Once | INTRATHECAL | 0 refills | Status: CP
Start: 2022-10-14 — End: ?
  Administered 2022-10-14 (×3): 20.000 mL via INTRATHECAL

## 2022-10-28 ENCOUNTER — Encounter: Admit: 2022-10-28 | Discharge: 2022-10-28 | Payer: MEDICARE

## 2022-10-28 ENCOUNTER — Ambulatory Visit: Admit: 2022-10-28 | Discharge: 2022-10-29 | Payer: MEDICARE

## 2022-10-28 DIAGNOSIS — F32A Depression: Secondary | ICD-10-CM

## 2022-10-28 DIAGNOSIS — M797 Fibromyalgia: Secondary | ICD-10-CM

## 2022-10-28 DIAGNOSIS — M5134 Other intervertebral disc degeneration, thoracic region: Secondary | ICD-10-CM

## 2022-10-28 DIAGNOSIS — C801 Malignant (primary) neoplasm, unspecified: Secondary | ICD-10-CM

## 2022-10-28 DIAGNOSIS — U071 COVID-19: Secondary | ICD-10-CM

## 2022-10-28 DIAGNOSIS — M961 Postlaminectomy syndrome, not elsewhere classified: Secondary | ICD-10-CM

## 2022-10-28 DIAGNOSIS — N200 Calculus of kidney: Secondary | ICD-10-CM

## 2022-10-28 DIAGNOSIS — B1910 Unspecified viral hepatitis B without hepatic coma: Secondary | ICD-10-CM

## 2022-10-28 DIAGNOSIS — I10 Essential (primary) hypertension: Secondary | ICD-10-CM

## 2022-10-28 DIAGNOSIS — M2550 Pain in unspecified joint: Secondary | ICD-10-CM

## 2022-10-28 DIAGNOSIS — M5136 Other intervertebral disc degeneration, lumbar region: Secondary | ICD-10-CM

## 2022-10-28 DIAGNOSIS — F419 Anxiety disorder, unspecified: Secondary | ICD-10-CM

## 2022-10-28 DIAGNOSIS — R519 Generalized headaches: Secondary | ICD-10-CM

## 2022-10-28 DIAGNOSIS — I499 Cardiac arrhythmia, unspecified: Secondary | ICD-10-CM

## 2022-10-28 MED ORDER — BUPRENORPHINE HCL 2 MG SL SUBL
2 mg | ORAL_TABLET | Freq: Two times a day (BID) | SUBLINGUAL | 0 refills | Status: AC
Start: 2022-10-28 — End: ?

## 2022-10-28 NOTE — Progress Notes
[facility]IN[facility]       SUBJECTIVE:   Chronic back pain  Mostly axial   Feels constant pain   Does not feel he has good quality of life   Pain is persisting and nothing seems to help   He has ITP and did feel this was helpful at one point   Also has SCS and finds he can tell when this is off, the pain is worse   He pushes through the day to get his work done, but pain does impact his quality of life   Started a slow wean/titration of his ITP   Recent addition of subutex 2mg  daily     Doesn't not feel any worse than before   However does notice some cognitive fog/seeing things   It has been better but still mildly feeling some cognitive change  ?  MEDS   Acetaminophen about 3g a day   Gabapentin??  lexapro  Elavil  subutex 2mg  daily   ?  Past tried lyrica with side effects   Prialt ITP With hallucinations  Fentanyl ITP without relief  Hydromorphone without relief?         Review of Systems    Current Outpatient Medications:   ?  amitriptyline (ELAVIL) 25 mg tablet, Take one tablet by mouth at bedtime daily. Indications: neuropathic pain, Disp: 90 tablet, Rfl: 3  ?  amLODIPine (NORVASC) 5 mg tablet, Take one tablet by mouth daily., Disp: , Rfl:   ?  buprenorphine HCl (SUBUTEX) 2 mg sublingual tablet, Place one tablet under tongue twice daily., Disp: 60 tablet, Rfl: 0  ?  doxazosin (CARDURA) 2 mg tablet, Take two tablets by mouth at bedtime daily., Disp: , Rfl:   ?  escitalopram oxalate (LEXAPRO) 20 mg tablet, , Disp: , Rfl:   ?  fluticasone propionate (FLONASE) 50 mcg/actuation nasal spray, suspension, Apply fifty sprays into nose as directed as Needed., Disp: , Rfl:   ?  gabapentin (NEURONTIN) 300 mg capsule, TAKE 1 CAPSULE BY MOUTH AT BEDTIME MAY INCREASE TO 2 AT BEDTIME AFTER 2 WEEKS, Disp: , Rfl:   ?  hydroCHLOROthiazide (HYDRODIURIL) 25 mg tablet, Take one tablet by mouth every morning., Disp: , Rfl:   ?  metoprolol tartrate (LOPRESSOR) 100 mg tablet, Take one tablet by mouth twice daily., Disp: , Rfl: ?  morphine  PF (INFUMORPH) 25 mg/mL 9.6 mg/mL, bupivacaine PF (MARCAINE) 0.75 % 4.6 mg/mL in sodium chloride PF 0.9% 0.9 % 20 mL intrathecal infusion syr, by Intrathecal route Continuous. Managed by Dr Janyth Contes and Z. Ailana Cuadrado APRN [facility], Disp: , Rfl:   ?  multivit-min-FA-lycopen-lutein 300-600-300 mcg tab, Take 1 tablet by mouth daily., Disp: , Rfl:   ?  senna/docusate (SENOKOT-S) 8.6/50 mg tablet, Take one tablet by mouth twice daily., Disp: , Rfl:   ?  temazepam (RESTORIL) 15 mg capsule, Take one capsule by mouth at bedtime as needed., Disp: , Rfl:   ?  valsartan (DIOVAN) 160 mg tablet, Take one tablet by mouth daily., Disp: , Rfl:   ?  warfarin sodium (WARFARIN PO), Take  by mouth., Disp: , Rfl:   Allergies   Allergen Reactions   ? Cymbalta [Duloxetine] MENTAL STATUS CHANGES     Physical Exam  Vitals:    10/28/22 1243   BP: (!) 150/88   BP Source: Arm, Left Upper   Pulse: 78   SpO2: 100%   PainSc: Seven   Weight: 90.7 kg (200 lb)   Height: 177.8  cm (5' 10)     Oswestry Total Score:: 48  Pain Score: Seven  Body mass index is 28.7 kg/m?Marland Kitchen    General: Alert, oriented, moderate distress  HEENT: normocephalic  NECK: supple  Resp: Non labored breathing, no distress  Cardio: Pedal pulses palpable bilaterally, mild lower extremity edema  MS: TTP in lumbar region of spine and paraspinal muscles.  NEURO: Cranial nerves II-XII intact. Motor strength & sensory exams normal.  Gait antalgic.   Behavior: Calm, cooperative, behavior and speech normal.          IMPRESSION:  1. Chronic intractable pain    2. Postlaminectomy syndrome, lumbar region    3. Presence of intrathecal pump          PLAN:  Reduce ITP   Increase subutex 2mg  BID   Watch for side effects   Call sooner if issues   Plan to see him back Dec 18th for re-eval and continue plan for ITP reduciton if tolerated   May consider increase subutex to 4mg  BID at next visit

## 2022-10-28 NOTE — Procedures
ANESTHESIA PROCEDURE REPORT    Intrathecal Pump Analysis & Reprogramming    Date of Service: 10/28/2022    Procedure Title(s):    1. Electronic analysis & reprogramming of infusion pump    Attending Surgeon: Blanchard Kelch Seyon Strader, APRN-NP    Pre-Procedure Diagnosis:   1. Chronic intractable pain    2. Postlaminectomy syndrome, lumbar region    3. Presence of intrathecal pump        Post-Procedure Diagnosis:   1. Chronic intractable pain    2. Postlaminectomy syndrome, lumbar region    3. Presence of intrathecal pump        Indications: Kristopher Richard is a 76 y.o. male with above diagnosis. The patient is here today for reprogramming of an implanted intrathecal infusion pump. The patient's history and physical exam were reviewed. The risks, benefits and alternatives to the procedure were discussed, and all questions were answered to the patient's satisfaction.     Procedure in Detail: The patient was in a seated position in a chair. Interrogation was performed and revealed the following settings:    Drug/Concentration: Bupivacaine 4.6 mg/mL  and Morphine 9.6 mg/mL Reservoir Capacity: 20 mL  Infusion Mode/Rate: Simple continuous at 1.1 mg per day  Patient Controlled Bolus: none  Reservoir Volume: 17 mL    After reprogramming, the following settings were noted:    Infusion Mode/Rate: Simple continuous at 0.88 mg per day  Patient Controlled Bolus: none  Low Reservoir Alarm Date: 17     Disposition: The patient tolerated the procedure well, and there were no apparent complications.     Estimated Blood Loss: none    Complications: none

## 2022-10-29 DIAGNOSIS — G8929 Other chronic pain: Secondary | ICD-10-CM

## 2022-10-29 DIAGNOSIS — Z451 Encounter for adjustment and management of infusion pump: Secondary | ICD-10-CM

## 2022-10-29 DIAGNOSIS — Z978 Presence of other specified devices: Secondary | ICD-10-CM

## 2022-11-08 ENCOUNTER — Encounter: Admit: 2022-11-08 | Discharge: 2022-11-08 | Payer: MEDICARE

## 2022-11-08 MED ORDER — BUPRENORPHINE HCL 2 MG SL SUBL
ORAL_TABLET | 0 refills
Start: 2022-11-08 — End: ?

## 2022-11-15 ENCOUNTER — Encounter: Admit: 2022-11-15 | Discharge: 2022-11-15 | Payer: MEDICARE

## 2022-11-15 ENCOUNTER — Ambulatory Visit: Admit: 2022-11-15 | Discharge: 2022-11-16 | Payer: MEDICARE

## 2022-11-15 DIAGNOSIS — M961 Postlaminectomy syndrome, not elsewhere classified: Secondary | ICD-10-CM

## 2022-11-15 DIAGNOSIS — M47812 Spondylosis without myelopathy or radiculopathy, cervical region: Secondary | ICD-10-CM

## 2022-11-15 DIAGNOSIS — Z978 Presence of other specified devices: Secondary | ICD-10-CM

## 2022-11-15 DIAGNOSIS — G8929 Other chronic pain: Secondary | ICD-10-CM

## 2022-11-15 MED ORDER — MORPHINE 9.6MG/ML + BUPIVACAINE 4.6MG/ML IT PAIN PUMP SYR
Freq: Once | INTRATHECAL | 0 refills | Status: CP
Start: 2022-11-15 — End: ?
  Administered 2022-11-15 (×3): 20.000 mL via INTRATHECAL

## 2022-11-15 MED ORDER — BUPRENORPHINE HCL 2 MG SL SUBL
4 mg | ORAL_TABLET | Freq: Two times a day (BID) | SUBLINGUAL | 0 refills | Status: AC
Start: 2022-11-15 — End: ?

## 2022-11-15 NOTE — Procedures
ANESTHESIA PROCEDURE REPORT    Intrathecal Pump Analysis & Reprogramming    Date of Service: 11/15/2022    Procedure Title(s):    1. Electronic analysis & reprogramming of infusion pump    Attending Surgeon: Blanchard Kelch Gamal Todisco, APRN-NP    Pre-Procedure Diagnosis:   1. Cervical spondylosis without myelopathy    2. Chronic intractable pain    3. Postlaminectomy syndrome, lumbar region    4. Presence of intrathecal pump    5. Bilateral occipital neuralgia        Post-Procedure Diagnosis:   1. Cervical spondylosis without myelopathy    2. Chronic intractable pain    3. Postlaminectomy syndrome, lumbar region    4. Presence of intrathecal pump    5. Bilateral occipital neuralgia        Indications: Kristopher Richard is a 76 y.o. male with above diagnosis. The patient is here today for reprogramming of an implanted intrathecal infusion pump. The patient's history and physical exam were reviewed. The risks, benefits and alternatives to the procedure were discussed, and all questions were answered to the patient's satisfaction.     Procedure in Detail: The patient was in a seated position in a chair. Interrogation was performed and revealed the following settings:    Drug/Concentration: Bupivacaine 4.6 mg/mL  and Morphine 9.6 mg/mL Reservoir Capacity: 20 mL  Infusion Mode/Rate: Simple continuous at 0.88 mg per day  Patient Controlled Bolus: none  Reservoir Volume: 16 mL    After reprogramming, the following settings were noted:    Infusion Mode/Rate: Simple continuous at 0.75 mg per day  Patient Controlled Bolus: none  Low Reservoir Alarm Date: 05/01/23     Disposition: The patient tolerated the procedure well, and there were no apparent complications.     Estimated Blood Loss: none    Complications: none

## 2022-11-15 NOTE — Progress Notes
[facility]IN[facility] NOTE       SUBJECTIVE:   Chronic back pain   Neck pain and HA   Pain is overall worse   Limits mobility and quality of life     Has been weaning ITP   Started subutex 2mg  and now taking bid   Ran out of meds and noticed a change   When he got back on the meds, felt the change improved   Tolerates meds     Comes in for med check and ITP eval        Review of Systems    Current Outpatient Medications:     amitriptyline (ELAVIL) 25 mg tablet, Take one tablet by mouth at bedtime daily. Indications: neuropathic pain, Disp: 90 tablet, Rfl: 3    amLODIPine (NORVASC) 5 mg tablet, Take one tablet by mouth daily., Disp: , Rfl:     buprenorphine HCl (SUBUTEX) 2 mg sublingual tablet, Place two tablets under tongue twice daily., Disp: 120 tablet, Rfl: 0    [START ON 12/14/2022] buprenorphine HCl (SUBUTEX) 2 mg sublingual tablet, Place two tablets under tongue twice daily for 30 days., Disp: 120 tablet, Rfl: 0    doxazosin (CARDURA) 2 mg tablet, Take two tablets by mouth at bedtime daily., Disp: , Rfl:     escitalopram oxalate (LEXAPRO) 20 mg tablet, , Disp: , Rfl:     fluticasone propionate (FLONASE) 50 mcg/actuation nasal spray, suspension, Apply fifty sprays into nose as directed as Needed., Disp: , Rfl:     gabapentin (NEURONTIN) 300 mg capsule, TAKE 1 CAPSULE BY MOUTH AT BEDTIME MAY INCREASE TO 2 AT BEDTIME AFTER 2 WEEKS, Disp: , Rfl:     hydroCHLOROthiazide (HYDRODIURIL) 25 mg tablet, Take one tablet by mouth every morning., Disp: , Rfl:     metoprolol tartrate (LOPRESSOR) 100 mg tablet, Take one tablet by mouth twice daily., Disp: , Rfl:     morphine  PF (INFUMORPH) 25 mg/mL 9.6 mg/mL, bupivacaine PF (MARCAINE) 0.75 % 4.6 mg/mL in sodium chloride PF 0.9% 0.9 % 20 mL intrathecal infusion syr, by Intrathecal route Continuous. Managed by Dr Janyth Contes and Z. Jaylan Hinojosa APRN in [facility], Disp: , Rfl:     multivit-min-FA-lycopen-lutein 300-600-300 mcg tab, Take 1 tablet by mouth daily., Disp: , Rfl: senna/docusate (SENOKOT-S) 8.6/50 mg tablet, Take one tablet by mouth twice daily., Disp: , Rfl:     temazepam (RESTORIL) 15 mg capsule, Take one capsule by mouth at bedtime as needed., Disp: , Rfl:     valsartan (DIOVAN) 160 mg tablet, Take one tablet by mouth daily., Disp: , Rfl:     warfarin sodium (WARFARIN PO), Take  by mouth., Disp: , Rfl:     Current Facility-Administered Medications:     morphine  PF (INFUMORPH) 9.6 mg/mL, bupivacaine PF (MARCAINE) 4.6 mg/mL in sodium chloride PF 0.9% 20 mL intrathecal infusion syr, , Intrathecal, ONCE, Mashell Sieben M, APRN-NP  Allergies   Allergen Reactions    Cymbalta [Duloxetine] MENTAL STATUS CHANGES     Physical Exam  Vitals:    11/15/22 1214   BP: (!) 147/91   BP Source: Arm, Left Upper   Pulse: 82   SpO2: 100%   PainSc: Eight   Weight: 90.7 kg (200 lb)   Height: 177.8 cm (5' 10)     Oswestry Total Score:: 52  Pain Score: Eight  Body mass index is 28.7 kg/m?Marland Kitchen    General: Alert, oriented, moderate distress  HEENT: normocephalic  NECK: TTP along c spine  and paracerv muscles. Limited ROM  Resp: Non labored breathing, no distress  Cardio: Pedal pulses palpable bilaterally, mild lower extremity edema  MS: TTP in lumbar region of spine and paraspinal muscles.  NEURO: Cranial nerves II-XII intact. Motor strength adequate, sensory exams unchanged. Gait antalgic.   Behavior: Calm, cooperative, behavior and speech normal.          IMPRESSION:  1. Cervical spondylosis without myelopathy    2. Chronic intractable pain    3. Postlaminectomy syndrome, lumbar region    4. Presence of intrathecal pump    5. Bilateral occipital neuralgia          PLAN:      ITP reprogram   Reduced to 0.75mg  daily   Increase subutex to 4mg  bid   Plan f.u for bil occipital NB   If not better, or not lasting, suggest MBB to eval for RFA for neck and HA pain

## 2022-11-16 DIAGNOSIS — M5481 Occipital neuralgia: Secondary | ICD-10-CM

## 2022-11-24 ENCOUNTER — Encounter: Admit: 2022-11-24 | Discharge: 2022-11-24 | Payer: MEDICARE

## 2022-11-24 NOTE — Telephone Encounter
RN called pt to let him know it was approved for higher dose.

## 2022-12-09 ENCOUNTER — Encounter: Admit: 2022-12-09 | Discharge: 2022-12-09 | Payer: MEDICARE

## 2022-12-09 ENCOUNTER — Ambulatory Visit: Admit: 2022-12-09 | Discharge: 2022-12-10 | Payer: MEDICARE

## 2022-12-09 DIAGNOSIS — F32A Depression: Secondary | ICD-10-CM

## 2022-12-09 DIAGNOSIS — B1910 Unspecified viral hepatitis B without hepatic coma: Secondary | ICD-10-CM

## 2022-12-09 DIAGNOSIS — R519 Generalized headaches: Secondary | ICD-10-CM

## 2022-12-09 DIAGNOSIS — Z451 Encounter for adjustment and management of infusion pump: Secondary | ICD-10-CM

## 2022-12-09 DIAGNOSIS — I499 Cardiac arrhythmia, unspecified: Secondary | ICD-10-CM

## 2022-12-09 DIAGNOSIS — M797 Fibromyalgia: Secondary | ICD-10-CM

## 2022-12-09 DIAGNOSIS — C801 Malignant (primary) neoplasm, unspecified: Secondary | ICD-10-CM

## 2022-12-09 DIAGNOSIS — I10 Essential (primary) hypertension: Secondary | ICD-10-CM

## 2022-12-09 DIAGNOSIS — M5136 Other intervertebral disc degeneration, lumbar region: Secondary | ICD-10-CM

## 2022-12-09 DIAGNOSIS — N200 Calculus of kidney: Secondary | ICD-10-CM

## 2022-12-09 DIAGNOSIS — M5134 Other intervertebral disc degeneration, thoracic region: Secondary | ICD-10-CM

## 2022-12-09 DIAGNOSIS — M2550 Pain in unspecified joint: Secondary | ICD-10-CM

## 2022-12-09 DIAGNOSIS — F419 Anxiety disorder, unspecified: Secondary | ICD-10-CM

## 2022-12-09 DIAGNOSIS — U071 COVID-19: Secondary | ICD-10-CM

## 2022-12-10 DIAGNOSIS — M5481 Occipital neuralgia: Secondary | ICD-10-CM

## 2022-12-15 ENCOUNTER — Ambulatory Visit: Admit: 2022-12-15 | Discharge: 2022-12-15 | Payer: MEDICARE

## 2022-12-28 ENCOUNTER — Encounter: Admit: 2022-12-28 | Discharge: 2022-12-28 | Payer: MEDICARE

## 2022-12-28 MED ORDER — BUPRENORPHINE HCL 2 MG SL SUBL
ORAL_TABLET | 0 refills
Start: 2022-12-28 — End: ?

## 2022-12-30 ENCOUNTER — Encounter: Admit: 2022-12-30 | Discharge: 2022-12-30 | Payer: MEDICARE

## 2022-12-30 MED ORDER — BUPRENORPHINE HCL 2 MG SL SUBL
ORAL_TABLET | 0 refills
Start: 2022-12-30 — End: ?

## 2023-01-26 ENCOUNTER — Encounter: Admit: 2023-01-26 | Discharge: 2023-01-26 | Payer: MEDICARE

## 2023-01-26 MED ORDER — BUPRENORPHINE HCL 2 MG SL SUBL
ORAL_TABLET | 0 refills
Start: 2023-01-26 — End: ?

## 2023-02-28 ENCOUNTER — Encounter: Admit: 2023-02-28 | Discharge: 2023-02-28 | Payer: MEDICARE

## 2023-02-28 MED ORDER — BUPRENORPHINE HCL 2 MG SL SUBL
ORAL_TABLET | 0 refills
Start: 2023-02-28 — End: ?

## 2023-03-14 ENCOUNTER — Encounter: Admit: 2023-03-14 | Discharge: 2023-03-14 | Payer: MEDICARE

## 2023-03-14 ENCOUNTER — Ambulatory Visit: Admit: 2023-03-14 | Discharge: 2023-03-14 | Payer: MEDICARE

## 2023-03-14 DIAGNOSIS — M5134 Other intervertebral disc degeneration, thoracic region: Secondary | ICD-10-CM

## 2023-03-14 DIAGNOSIS — M797 Fibromyalgia: Secondary | ICD-10-CM

## 2023-03-14 DIAGNOSIS — B1910 Unspecified viral hepatitis B without hepatic coma: Secondary | ICD-10-CM

## 2023-03-14 DIAGNOSIS — F32A Depression: Secondary | ICD-10-CM

## 2023-03-14 DIAGNOSIS — I499 Cardiac arrhythmia, unspecified: Secondary | ICD-10-CM

## 2023-03-14 DIAGNOSIS — N200 Calculus of kidney: Secondary | ICD-10-CM

## 2023-03-14 DIAGNOSIS — M545 Chronic midline low back pain without sciatica: Secondary | ICD-10-CM

## 2023-03-14 DIAGNOSIS — U071 COVID-19: Secondary | ICD-10-CM

## 2023-03-14 DIAGNOSIS — C801 Malignant (primary) neoplasm, unspecified: Secondary | ICD-10-CM

## 2023-03-14 DIAGNOSIS — G8929 Other chronic pain: Secondary | ICD-10-CM

## 2023-03-14 DIAGNOSIS — M5136 Other intervertebral disc degeneration, lumbar region: Secondary | ICD-10-CM

## 2023-03-14 DIAGNOSIS — R519 Generalized headaches: Secondary | ICD-10-CM

## 2023-03-14 DIAGNOSIS — M2550 Pain in unspecified joint: Secondary | ICD-10-CM

## 2023-03-14 DIAGNOSIS — I10 Essential (primary) hypertension: Secondary | ICD-10-CM

## 2023-03-14 DIAGNOSIS — Z451 Encounter for adjustment and management of infusion pump: Secondary | ICD-10-CM

## 2023-03-14 DIAGNOSIS — F419 Anxiety disorder, unspecified: Secondary | ICD-10-CM

## 2023-03-14 MED ORDER — TRIAMCINOLONE ACETONIDE 40 MG/ML IJ SUSP
40 mg | Freq: Once | 0 refills | Status: CP
Start: 2023-03-14 — End: ?
  Administered 2023-03-14: 21:00:00 40 mg

## 2023-03-14 MED ORDER — MORPHINE 9.6MG/ML + BUPIVACAINE 4.6MG/ML IT PAIN PUMP SYR
Freq: Once | INTRATHECAL | 0 refills | Status: AC
Start: 2023-03-14 — End: ?

## 2023-03-14 MED ORDER — BUPIVACAINE (PF) 0.5 % (5 MG/ML) IJ SOLN
4 mL | Freq: Once | INTRAMUSCULAR | 0 refills | Status: CP
Start: 2023-03-14 — End: ?
  Administered 2023-03-14: 21:00:00 4 mL via INTRAMUSCULAR

## 2023-03-14 NOTE — Progress Notes
[facility]IN[facility] NOTE       SUBJECTIVE:   Chronic back pain   ITP morphine and bupi   Has been weaning   Started oral subutex   Has been taking for several months   Recently noticing more adverse effects   Reports diaphoresis, dizziness and irritability     Sharp pain along the left lower back   Concentrated around battery site   Holding pressure over this area does help  Occasional back brace   Uses voltaren for joints            Review of Systems   Constitutional: Negative.    HENT: Negative.     Eyes: Negative.    Respiratory: Negative.     Cardiovascular: Negative.    Gastrointestinal: Negative.    Endocrine: Negative.    Genitourinary: Negative.    Musculoskeletal:  Positive for arthralgias, back pain, gait problem and myalgias.   Skin: Negative.    Allergic/Immunologic: Negative.    Hematological: Negative.    Psychiatric/Behavioral: Negative.         Current Outpatient Medications:     acetaminophen (TYLENOL EXTRA STRENGTH) 500 mg tablet, Take two tablets by mouth every 8 hours as needed., Disp: , Rfl:     amitriptyline (ELAVIL) 25 mg tablet, Take one tablet by mouth at bedtime daily. Indications: neuropathic pain, Disp: 90 tablet, Rfl: 3    amLODIPine (NORVASC) 5 mg tablet, Take one tablet by mouth daily., Disp: , Rfl:     buprenorphine HCl (SUBUTEX) 2 mg sublingual tablet, PLACE 2 TABLETS UNDER THE TONGUE TWICE DAILY, Disp: 120 tablet, Rfl: 0    buprenorphine HCl (SUBUTEX) 2 mg sublingual tablet, Place two tablets under tongue twice daily., Disp: 120 tablet, Rfl: 0    CHOLEcalciferoL (vitamin D3) (DIALYVITE VITAMIN D) 125 mcg (5,000 unit) capsule, Take 125 mcg by mouth daily., Disp: , Rfl:     cyanocobalamin (vitamin B-12) 1,000 mcg tablet, Take one tablet by mouth daily., Disp: , Rfl:     doxazosin (CARDURA) 2 mg tablet, Take two tablets by mouth at bedtime daily., Disp: , Rfl:     escitalopram oxalate (LEXAPRO) 20 mg tablet, , Disp: , Rfl:     fluticasone propionate (FLONASE) 50 mcg/actuation nasal spray, suspension, Apply fifty sprays into nose as directed as Needed., Disp: , Rfl:     gabapentin (NEURONTIN) 300 mg capsule, TAKE 1 CAPSULE BY MOUTH AT BEDTIME MAY INCREASE TO 2 AT BEDTIME AFTER 2 WEEKS, Disp: , Rfl:     hydroCHLOROthiazide (HYDRODIURIL) 25 mg tablet, Take one tablet by mouth every morning., Disp: , Rfl:     ipratropium bromide (ATROVENT) 42 mcg (0.06 %) nasal spray, Apply two sprays to each nostril as directed daily., Disp: , Rfl:     methylphenidate hcl (RITALIN) 20 mg tablet, Take one tablet by mouth daily., Disp: , Rfl:     metoprolol tartrate (LOPRESSOR) 100 mg tablet, Take one tablet by mouth twice daily., Disp: , Rfl:     morphine  PF (INFUMORPH) 25 mg/mL 9.6 mg/mL, bupivacaine PF (MARCAINE) 0.75 % 4.6 mg/mL in sodium chloride PF 0.9% 0.9 % 20 mL intrathecal infusion syr, by Intrathecal route Continuous. Managed by Dr Janyth Contes and Z. Vanna Shavers APRN in [facility], Disp: , Rfl:     multivit-min-FA-lycopen-lutein 300-600-300 mcg tab, Take 1 tablet by mouth daily., Disp: , Rfl:     QUEtiapine (SEROQUEL) 25 mg tablet, Take two tablets by mouth daily., Disp: , Rfl:  senna/docusate (SENOKOT-S) 8.6/50 mg tablet, Take one tablet by mouth twice daily., Disp: , Rfl:     temazepam (RESTORIL) 15 mg capsule, Take one capsule by mouth at bedtime as needed., Disp: , Rfl:     testosterone cypionate (DEPO-TESTOSTERONE) 100 mg/mL injection, Inject 1 mL into the muscle every 14 days., Disp: , Rfl:     valsartan (DIOVAN) 160 mg tablet, Take one tablet by mouth daily., Disp: , Rfl:     warfarin sodium (WARFARIN PO), Take  by mouth., Disp: , Rfl:     XARELTO 20 mg tablet, Take one tablet by mouth daily., Disp: , Rfl:     Current Facility-Administered Medications:     morphine  PF (INFUMORPH) 9.6 mg/mL, bupivacaine PF (MARCAINE) 4.6 mg/mL in sodium chloride PF 0.9% 20 mL intrathecal infusion syr, , Intrathecal, ONCE, Rockey Guarino M, APRN-NP  Allergies   Allergen Reactions    Cymbalta [Duloxetine] MENTAL STATUS CHANGES     Physical Exam  Vitals:    03/14/23 1511   BP: (!) 146/88   BP Source: Arm, Right Upper   Pulse: 90   Temp: 36.4 ?C (97.5 ?F)   Resp: 22   SpO2: 100%   TempSrc: Skin   PainSc: Nine   Weight: 90.7 kg (200 lb)   Height: 177.8 cm (5' 10)     Oswestry Total Score:: 56  Pain Score: Nine  Body mass index is 28.7 kg/m?Marland Kitchen  General: Alert, oriented, mild distress  HEENT: normocephalic  NECK: TTP along c spine and paracerv muscles.   Resp: Non labored breathing, no distress  Cardio: Pedal pulses palpable bilaterally, mild lower extremity edema  MS: TTP in lumbar region of spine and paraspinal muscles. Left battery pack for SCS, right ITP site   NEURO: Cranial nerves II-XII intact. Motor strength adequate against gravity, sensory exams normal.  Gait coordinated.   Behavior: Calm, cooperative, behavior and speech normal.           IMPRESSION:  1. Adjustment and management of infusion pump    2. 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

## 2023-03-14 NOTE — Procedures
ANESTHESIA PROCEDURE REPORT    Intrathecal Pump Analysis & Reprogramming    Date of Service: 03/14/2023    Procedure Title(s):    1. Electronic analysis & reprogramming of infusion pump    Attending Surgeon: Thayer Dallas , APRN-NP    Pre-Procedure Diagnosis:   1. Adjustment and management of infusion pump    2. Chronic intractable pain    3. Chronic midline low back pain without sciatica        Post-Procedure Diagnosis:   1. Adjustment and management of infusion pump    2. Chronic intractable pain    3. Chronic midline low back pain without sciatica        Indications: Kristopher Richard is a 77 y.o. male with above diagnosis. The patient is here today for reprogramming of an implanted intrathecal infusion pump. The patient's history and physical exam were reviewed. The risks, benefits and alternatives to the procedure were discussed, and all questions were answered to the patient's satisfaction.     Procedure in Detail: The patient was in a seated position in a chair. Interrogation was performed and revealed the following settings:    Drug/Concentration: Bupivacaine 4.6 mg/mL  and Morphine 9.6 mg/mL Reservoir Capacity: 20 mL  Infusion Mode/Rate: Simple continuous at 0.6 mg per day  Patient Controlled Bolus:none   Reservoir Volume: 8 mL    After reprogramming, the following settings were noted:    Infusion Mode/Rate: Simple continuous at 0.5 mg per day  Patient Controlled Bolus: none  Low Reservoir Alarm Date: 05/2023     Disposition: The patient tolerated the procedure well, and there were no apparent complications.     Estimated Blood Loss: none    Complications: none

## 2023-03-29 ENCOUNTER — Encounter: Admit: 2023-03-29 | Discharge: 2023-03-29 | Payer: MEDICARE

## 2023-03-29 MED ORDER — BUPRENORPHINE HCL 2 MG SL SUBL
ORAL_TABLET | 0 refills
Start: 2023-03-29 — End: ?

## 2023-03-29 NOTE — Telephone Encounter
Patient requesting refill of buprenorphine`  Last Office Visit: 03/14/23  Next Office Visit: 03/30/23  Previous script: 03/01/23    Requesting refill

## 2023-03-30 ENCOUNTER — Encounter: Admit: 2023-03-30 | Discharge: 2023-03-30 | Payer: MEDICARE

## 2023-03-30 MED ORDER — BUPRENORPHINE HCL 2 MG SL SUBL
ORAL_TABLET | 0 refills
Start: 2023-03-30 — End: ?

## 2023-04-14 ENCOUNTER — Encounter: Admit: 2023-04-14 | Discharge: 2023-04-14 | Payer: MEDICARE

## 2023-04-14 ENCOUNTER — Ambulatory Visit: Admit: 2023-04-14 | Discharge: 2023-04-14 | Payer: MEDICARE

## 2023-04-14 DIAGNOSIS — F32A Depression: Secondary | ICD-10-CM

## 2023-04-14 DIAGNOSIS — R519 Generalized headaches: Secondary | ICD-10-CM

## 2023-04-14 DIAGNOSIS — F419 Anxiety disorder, unspecified: Secondary | ICD-10-CM

## 2023-04-14 DIAGNOSIS — M5136 Other intervertebral disc degeneration, lumbar region: Secondary | ICD-10-CM

## 2023-04-14 DIAGNOSIS — M797 Fibromyalgia: Secondary | ICD-10-CM

## 2023-04-14 DIAGNOSIS — I499 Cardiac arrhythmia, unspecified: Secondary | ICD-10-CM

## 2023-04-14 DIAGNOSIS — N200 Calculus of kidney: Secondary | ICD-10-CM

## 2023-04-14 DIAGNOSIS — I10 Essential (primary) hypertension: Secondary | ICD-10-CM

## 2023-04-14 DIAGNOSIS — U071 COVID-19: Secondary | ICD-10-CM

## 2023-04-14 DIAGNOSIS — M2550 Pain in unspecified joint: Secondary | ICD-10-CM

## 2023-04-14 DIAGNOSIS — B1910 Unspecified viral hepatitis B without hepatic coma: Secondary | ICD-10-CM

## 2023-04-14 DIAGNOSIS — M5134 Other intervertebral disc degeneration, thoracic region: Secondary | ICD-10-CM

## 2023-04-14 DIAGNOSIS — G8929 Other chronic pain: Secondary | ICD-10-CM

## 2023-04-14 DIAGNOSIS — C801 Malignant (primary) neoplasm, unspecified: Secondary | ICD-10-CM

## 2023-04-14 DIAGNOSIS — Z79899 Other long term (current) drug therapy: Secondary | ICD-10-CM

## 2023-04-14 MED ORDER — BUPRENORPHINE HCL 8 MG SL SUBL
8 mg | ORAL_TABLET | Freq: Two times a day (BID) | SUBLINGUAL | 0 refills | Status: AC
Start: 2023-04-14 — End: ?

## 2023-04-14 NOTE — Progress Notes
Todays visit took place via phone. Total time 20 minutes.      [facility] NOTE       SUBJECTIVE:   Chronic pain   Has ITP morphine dose lowered to 0.5  Noticed increased sweating   Is feeling ill recently   Has appt with PCP     Started subutex   Taking 4mg  BID   Feels he tolerates this   Cannot tell if sx of sweating are from starting this med or from reducing ITP          Review of Systems    Current Outpatient Medications:     acetaminophen (TYLENOL EXTRA STRENGTH) 500 mg tablet, Take two tablets by mouth every 8 hours as needed., Disp: , Rfl:     amitriptyline (ELAVIL) 25 mg tablet, Take one tablet by mouth at bedtime daily. Indications: neuropathic pain, Disp: 90 tablet, Rfl: 3    amLODIPine (NORVASC) 5 mg tablet, Take one tablet by mouth daily., Disp: , Rfl:     buprenorphine HCL (SUBUTEX) 8 mg sublingual tablet, Place one tablet under tongue twice daily. Indications: chronic pain, Disp: 30 tablet, Rfl: 0    CHOLEcalciferoL (vitamin D3) (DIALYVITE VITAMIN D) 125 mcg (5,000 unit) capsule, Take 125 mcg by mouth daily., Disp: , Rfl:     cyanocobalamin (vitamin B-12) 1,000 mcg tablet, Take one tablet by mouth daily., Disp: , Rfl:     doxazosin (CARDURA) 2 mg tablet, Take two tablets by mouth at bedtime daily., Disp: , Rfl:     escitalopram oxalate (LEXAPRO) 20 mg tablet, , Disp: , Rfl:     fluticasone propionate (FLONASE) 50 mcg/actuation nasal spray, suspension, Apply fifty sprays into nose as directed as Needed., Disp: , Rfl:     gabapentin (NEURONTIN) 300 mg capsule, TAKE 1 CAPSULE BY MOUTH AT BEDTIME MAY INCREASE TO 2 AT BEDTIME AFTER 2 WEEKS, Disp: , Rfl:     hydroCHLOROthiazide (HYDRODIURIL) 25 mg tablet, Take one tablet by mouth every morning., Disp: , Rfl:     ipratropium bromide (ATROVENT) 42 mcg (0.06 %) nasal spray, Apply two sprays to each nostril as directed daily., Disp: , Rfl:     methylphenidate hcl (RITALIN) 20 mg tablet, Take one tablet by mouth daily., Disp: , Rfl:     metoprolol tartrate (LOPRESSOR) 100 mg tablet, Take one tablet by mouth twice daily., Disp: , Rfl:     morphine  PF (INFUMORPH) 25 mg/mL 9.6 mg/mL, bupivacaine PF (MARCAINE) 0.75 % 4.6 mg/mL in sodium chloride PF 0.9% 0.9 % 20 mL intrathecal infusion syr, by Intrathecal route Continuous. Managed by Dr Janyth Contes and Z. Margarito Dehaas APRN in [facility], Disp: , Rfl:     multivit-min-FA-lycopen-lutein 300-600-300 mcg tab, Take 1 tablet by mouth daily., Disp: , Rfl:     QUEtiapine (SEROQUEL) 25 mg tablet, Take two tablets by mouth daily., Disp: , Rfl:     senna/docusate (SENOKOT-S) 8.6/50 mg tablet, Take one tablet by mouth twice daily., Disp: , Rfl:     temazepam (RESTORIL) 15 mg capsule, Take one capsule by mouth at bedtime as needed., Disp: , Rfl:     testosterone cypionate (DEPO-TESTOSTERONE) 100 mg/mL injection, Inject 1 mL into the muscle every 14 days., Disp: , Rfl:     valsartan (DIOVAN) 160 mg tablet, Take one tablet by mouth daily., Disp: , Rfl:     warfarin sodium (WARFARIN PO), Take  by mouth., Disp: , Rfl:     XARELTO 20 mg tablet, Take one tablet  by mouth daily., Disp: , Rfl:   Allergies   Allergen Reactions    Cymbalta [Duloxetine] MENTAL STATUS CHANGES     Physical Exam  Vitals:    04/14/23 1136   PainSc: Seven   Weight: 90.7 kg (200 lb)   Height: 177.8 cm (5' 10)        Pain Score: Seven  Body mass index is 28.7 kg/m?Marland Kitchen     IMPRESSION:  1. Chronic intractable pain    2. Long-term use of high-risk medication        PLAN:   Plan to increase Subutex to 8mg  BID  He has 2mg  remaining, and will take 6 mg BID until he runs out, thereafter will start 8mg  BID and new RX provided.   Advised a higher dose may make a better impact on pain control   Advise f.u in 2 weeks to get his ITP reduced   Goal to wean off morphine as we optimize oral medication   Pt wife was on the phone as well   Questions answered   F.u in person for pump reduction

## 2023-05-05 ENCOUNTER — Ambulatory Visit: Admit: 2023-05-05 | Discharge: 2023-05-06 | Payer: MEDICARE

## 2023-05-05 ENCOUNTER — Encounter: Admit: 2023-05-05 | Discharge: 2023-05-05 | Payer: MEDICARE

## 2023-05-05 MED ORDER — AMITRIPTYLINE 25 MG PO TAB
25 mg | ORAL_TABLET | Freq: Every evening | ORAL | 3 refills | Status: AC
Start: 2023-05-05 — End: ?

## 2023-05-05 MED ORDER — BUPRENORPHINE HCL 8 MG SL SUBL
8 mg | ORAL_TABLET | Freq: Three times a day (TID) | SUBLINGUAL | 0 refills | Status: AC
Start: 2023-05-05 — End: ?

## 2023-05-30 ENCOUNTER — Encounter: Admit: 2023-05-30 | Discharge: 2023-05-30 | Payer: MEDICARE

## 2023-05-30 NOTE — Telephone Encounter
Reporting balance problems, nausea, loss of appetite, loss of grip, motor function disruption. After increasing buprenorphine at 2 tabs bid for 8 days    Pt believes that he was instructed to increase from 1 tab tid to 2 tabs bid.  He has returned to 1 tab tid.     He is calling to get a refill.  Will forward to Z. Hussaini for clarification.   He will be a few days short in July. Last fill 05/08/23    Will forward ot [facility] for clarity

## 2023-06-16 ENCOUNTER — Ambulatory Visit: Admit: 2023-06-16 | Discharge: 2023-06-17 | Payer: MEDICARE

## 2023-06-16 ENCOUNTER — Encounter: Admit: 2023-06-16 | Discharge: 2023-06-16 | Payer: MEDICARE

## 2023-06-16 DIAGNOSIS — Z79899 Other long term (current) drug therapy: Secondary | ICD-10-CM

## 2023-06-16 DIAGNOSIS — Z451 Encounter for adjustment and management of infusion pump: Secondary | ICD-10-CM

## 2023-06-16 DIAGNOSIS — G8929 Other chronic pain: Secondary | ICD-10-CM

## 2023-06-16 MED ORDER — BUPIVACAINE 5MG/ML IT PAIN PUMP SYR
Freq: Once | INTRATHECAL | 0 refills | Status: AC
Start: 2023-06-16 — End: ?

## 2023-06-16 MED ORDER — BUPRENORPHINE HCL 8 MG SL SUBL
SUBLINGUAL | 0 refills | Status: AC
Start: 2023-06-16 — End: ?

## 2023-06-16 MED ORDER — OXYCODONE-ACETAMINOPHEN 10-325 MG PO TAB
1 | ORAL_TABLET | ORAL | 0 refills | 2.00000 days | Status: AC | PRN
Start: 2023-06-16 — End: ?

## 2023-06-16 NOTE — Progress Notes
[facility] CLINIC NOTE       SUBJECTIVE:   Chronic back pain   Mostly axial   Reports most of the pain around the left lower back, battery pack of SCS at this location   Recently he has been feeling worse   + diaphoresis for yrs but feels more recently   Occasional balance issues   Pain mildly radiating down the leg   + loss of appetite, nausea - reports weight loss    Taking subutex 8mg  TID -  has noticed as he increased this medicaiton the side effects seem to be more prominent  Has been weaning ITP morphine   Is on 0.3mg  currently   Was tolerating the wean before, at higher doses did not feel pain was as well controlled   Comes in to discuss me changes     SCS Nevro   Still keeps this on and charged   Notices when the device is off the pain is worse        Review of Systems    Current Outpatient Medications:     acetaminophen (TYLENOL EXTRA STRENGTH) 500 mg tablet, Take two tablets by mouth every 8 hours as needed., Disp: , Rfl:     amitriptyline (ELAVIL) 25 mg tablet, Take one tablet by mouth at bedtime daily. Indications: neuropathic pain, Disp: 90 tablet, Rfl: 3    amLODIPine (NORVASC) 5 mg tablet, Take one tablet by mouth daily., Disp: , Rfl:     buprenorphine HCL (SUBUTEX) 8 mg sublingual tablet, Place one tablet under tongue twice daily for 5 days, THEN one tablet daily for 5 days. Indications: chronic pain, Disp: , Rfl:     CHOLEcalciferoL (vitamin D3) (DIALYVITE VITAMIN D) 125 mcg (5,000 unit) capsule, Take 125 mcg by mouth daily., Disp: , Rfl:     cyanocobalamin (vitamin B-12) 1,000 mcg tablet, Take one tablet by mouth daily., Disp: , Rfl:     doxazosin (CARDURA) 2 mg tablet, Take two tablets by mouth at bedtime daily., Disp: , Rfl:     escitalopram oxalate (LEXAPRO) 20 mg tablet, , Disp: , Rfl:     fluticasone propionate (FLONASE) 50 mcg/actuation nasal spray, suspension, Apply fifty sprays into nose as directed as Needed., Disp: , Rfl:     gabapentin (NEURONTIN) 300 mg capsule, TAKE 1 CAPSULE BY MOUTH AT BEDTIME MAY INCREASE TO 2 AT BEDTIME AFTER 2 WEEKS, Disp: , Rfl:     hydroCHLOROthiazide (HYDRODIURIL) 25 mg tablet, Take one tablet by mouth every morning., Disp: , Rfl:     ipratropium bromide (ATROVENT) 42 mcg (0.06 %) nasal spray, Apply two sprays to each nostril as directed daily., Disp: , Rfl:     methylphenidate hcl (RITALIN) 20 mg tablet, Take one tablet by mouth daily., Disp: , Rfl:     metoprolol tartrate (LOPRESSOR) 100 mg tablet, Take one tablet by mouth twice daily., Disp: , Rfl:     morphine  PF (INFUMORPH) 25 mg/mL 9.6 mg/mL, bupivacaine PF (MARCAINE) 0.75 % 4.6 mg/mL in sodium chloride PF 0.9% 0.9 % 20 mL intrathecal infusion syr, by Intrathecal route Continuous. Managed by Dr Janyth Contes and Z. Loki Wuthrich APRN in [facility], Disp: , Rfl:     multivit-min-FA-lycopen-lutein 300-600-300 mcg tab, Take 1 tablet by mouth daily., Disp: , Rfl:     oxyCODONE-acetaminophen (PERCOCET) 10-325 mg tablet, Take one tablet by mouth every 8 hours as needed for Pain., Disp: 90 tablet, Rfl: 0    QUEtiapine (SEROQUEL) 25 mg tablet, Take two tablets  by mouth daily., Disp: , Rfl:     senna/docusate (SENOKOT-S) 8.6/50 mg tablet, Take one tablet by mouth twice daily., Disp: , Rfl:     temazepam (RESTORIL) 15 mg capsule, Take one capsule by mouth at bedtime as needed., Disp: , Rfl:     testosterone cypionate (DEPO-TESTOSTERONE) 100 mg/mL injection, Inject 1 mL into the muscle every 14 days., Disp: , Rfl:     valsartan (DIOVAN) 160 mg tablet, Take one tablet by mouth daily., Disp: , Rfl:     warfarin sodium (WARFARIN PO), Take  by mouth., Disp: , Rfl:     XARELTO 20 mg tablet, Take one tablet by mouth daily., Disp: , Rfl:     Current Facility-Administered Medications:     bupivacaine PF (MARCAINE) 5 mg/mL in 20 mL intrathecal infusion syr, , Intrathecal, ONCE, Storie Heffern M, APRN-NP  Allergies   Allergen Reactions    Cymbalta [Duloxetine] MENTAL STATUS CHANGES     Physical Exam  Vitals:    06/16/23 1416   BP: 139/82 BP Source: Arm, Left Upper   Pulse: 76   SpO2: 98%   PainSc: Eight   Weight: 90.7 kg (200 lb)   Height: 177.8 cm (5' 10)     Oswestry Total Score:: 58  Pain Score: Eight  Body mass index is 28.7 kg/m?Marland Kitchen    General: Alert, oriented, mild distress  HEENT: normocephalic  NECK: supple  Resp: Non labored breathing, no distress  Cardio: Pedal pulses palpable bilaterally, no  lower extremity edema  MS: TTP in lumbar region of spine and paraspinal muscles.  NEURO: Cranial nerves II-XII intact. Motor strength unchanged, adequate against gravity, sensory exams normal. Gait antalgic, stiff movement  Behavior: Calm, cooperative, behavior and speech normal.        IMPRESSION:  1. Adjustment and management of infusion pump    2. Long-term use of high-risk medication    3. Chronic intractable pain          PLAN:   Wean off subutex   Advise 8mg  bid x 5 days, then 8mg  daily x 5 days then stop  Start percocet 10mg  up to TID   Will wean off ITP at next visit if sx better   Goal to refill bupivicaine only   Stim rep Almira Coaster present with Nevro to analyze stim settings and optimize stim programming

## 2023-06-17 ENCOUNTER — Encounter: Admit: 2023-06-17 | Discharge: 2023-06-17 | Payer: MEDICARE

## 2023-06-17 NOTE — Telephone Encounter
RN called pharmacy, they have to do a 7 day script first then another script needs to be sent in and PA done, called pt to let him know.

## 2023-06-21 ENCOUNTER — Encounter: Admit: 2023-06-21 | Discharge: 2023-06-21 | Payer: MEDICARE

## 2023-06-21 MED ORDER — OXYCODONE-ACETAMINOPHEN 10-325 MG PO TAB
1 | ORAL_TABLET | ORAL | 0 refills | 2.00000 days | Status: AC | PRN
Start: 2023-06-21 — End: ?

## 2023-07-04 ENCOUNTER — Encounter: Admit: 2023-07-04 | Discharge: 2023-07-04 | Payer: MEDICARE

## 2023-07-04 NOTE — Telephone Encounter
[employer] approved PA for Oxycodone/acetaminophen 10/325mg  till 11/29/2023

## 2023-07-23 ENCOUNTER — Encounter: Admit: 2023-07-23 | Discharge: 2023-07-23 | Payer: MEDICARE

## 2023-07-25 ENCOUNTER — Encounter: Admit: 2023-07-25 | Discharge: 2023-07-25 | Payer: MEDICARE

## 2023-07-25 DIAGNOSIS — Z451 Encounter for adjustment and management of infusion pump: Secondary | ICD-10-CM

## 2023-07-25 MED ORDER — OXYCODONE-ACETAMINOPHEN 10-325 MG PO TAB
1 | ORAL_TABLET | ORAL | 0 refills | PRN
Start: 2023-07-25 — End: ?

## 2023-07-25 NOTE — Telephone Encounter
Patient requesting refill of percocet  Last Office Visit: 06/16/23  Next Office Visit: 08/11/23  Previous script: 06/21/23    Request refill

## 2023-08-10 ENCOUNTER — Encounter: Admit: 2023-08-10 | Discharge: 2023-08-10 | Payer: MEDICARE

## 2023-08-10 ENCOUNTER — Ambulatory Visit: Admit: 2023-08-10 | Discharge: 2023-08-11 | Payer: MEDICARE

## 2023-08-10 DIAGNOSIS — I499 Cardiac arrhythmia, unspecified: Secondary | ICD-10-CM

## 2023-08-10 DIAGNOSIS — M545 Chronic midline low back pain without sciatica: Secondary | ICD-10-CM

## 2023-08-10 DIAGNOSIS — F32A Depression: Secondary | ICD-10-CM

## 2023-08-10 DIAGNOSIS — M2550 Pain in unspecified joint: Secondary | ICD-10-CM

## 2023-08-10 DIAGNOSIS — M5134 Other intervertebral disc degeneration, thoracic region: Secondary | ICD-10-CM

## 2023-08-10 DIAGNOSIS — N200 Calculus of kidney: Secondary | ICD-10-CM

## 2023-08-10 DIAGNOSIS — F419 Anxiety disorder, unspecified: Secondary | ICD-10-CM

## 2023-08-10 DIAGNOSIS — M5136 Other intervertebral disc degeneration, lumbar region: Secondary | ICD-10-CM

## 2023-08-10 DIAGNOSIS — C801 Malignant (primary) neoplasm, unspecified: Secondary | ICD-10-CM

## 2023-08-10 DIAGNOSIS — Z79899 Other long term (current) drug therapy: Secondary | ICD-10-CM

## 2023-08-10 DIAGNOSIS — I10 Essential (primary) hypertension: Secondary | ICD-10-CM

## 2023-08-10 DIAGNOSIS — B1910 Unspecified viral hepatitis B without hepatic coma: Secondary | ICD-10-CM

## 2023-08-10 DIAGNOSIS — U071 COVID-19: Secondary | ICD-10-CM

## 2023-08-10 DIAGNOSIS — M797 Fibromyalgia: Secondary | ICD-10-CM

## 2023-08-10 DIAGNOSIS — R519 Generalized headaches: Secondary | ICD-10-CM

## 2023-08-10 MED ORDER — OXYCODONE-ACETAMINOPHEN 10-325 MG PO TAB
1 | ORAL_TABLET | ORAL | 0 refills | 2.00000 days | Status: AC | PRN
Start: 2023-08-10 — End: ?

## 2023-08-10 MED ORDER — BUPIVACAINE 5MG/ML IT PAIN PUMP SYR
Freq: Once | INTRATHECAL | 0 refills | Status: CP
Start: 2023-08-10 — End: ?
  Administered 2023-08-10: 18:00:00 20.0000 mL via INTRATHECAL

## 2023-08-11 DIAGNOSIS — Z451 Encounter for adjustment and management of infusion pump: Secondary | ICD-10-CM

## 2023-11-04 ENCOUNTER — Encounter: Admit: 2023-11-04 | Discharge: 2023-11-04 | Payer: MEDICARE

## 2023-11-07 ENCOUNTER — Encounter: Admit: 2023-11-07 | Discharge: 2023-11-07 | Payer: MEDICARE

## 2023-11-09 ENCOUNTER — Encounter: Admit: 2023-11-09 | Discharge: 2023-11-09 | Payer: MEDICARE

## 2023-11-09 ENCOUNTER — Ambulatory Visit: Admit: 2023-11-09 | Discharge: 2023-11-09 | Payer: MEDICARE

## 2023-12-20 ENCOUNTER — Encounter: Admit: 2023-12-20 | Discharge: 2023-12-20 | Payer: MEDICARE

## 2023-12-20 DIAGNOSIS — G8929 Other chronic pain: Secondary | ICD-10-CM

## 2023-12-20 DIAGNOSIS — M545 Chronic midline low back pain without sciatica: Secondary | ICD-10-CM

## 2023-12-20 MED ORDER — OXYCODONE-ACETAMINOPHEN 10-325 MG PO TAB
1 | ORAL_TABLET | ORAL | 0 refills | 2.00000 days | Status: AC | PRN
Start: 2023-12-20 — End: ?

## 2023-12-20 NOTE — Telephone Encounter
Called to advise that their pharm only filled 7 days.   Called back to l/m advised I sent the remainder to [name], to sign

## 2023-12-28 ENCOUNTER — Encounter: Admit: 2023-12-28 | Discharge: 2023-12-28 | Payer: MEDICARE

## 2023-12-28 NOTE — Telephone Encounter
(  Key: [id number]) - [id number]  oxyCODONE-Acetaminophen 10-325MG  tablets  status: PA Response - Approved-Created: January 29th, 2025-Sent: January 29th, 2025

## 2024-01-05 ENCOUNTER — Encounter: Admit: 2024-01-05 | Discharge: 2024-01-05 | Payer: MEDICARE

## 2024-02-08 ENCOUNTER — Encounter: Admit: 2024-02-08 | Discharge: 2024-02-08 | Payer: MEDICARE

## 2024-02-08 ENCOUNTER — Ambulatory Visit: Admit: 2024-02-08 | Discharge: 2024-02-09 | Payer: MEDICARE

## 2024-02-13 ENCOUNTER — Encounter: Admit: 2024-02-13 | Discharge: 2024-02-13 | Payer: MEDICARE

## 2024-02-13 DIAGNOSIS — Z79899 Other long term (current) drug therapy: Secondary | ICD-10-CM

## 2024-05-09 ENCOUNTER — Encounter: Admit: 2024-05-09 | Discharge: 2024-05-09 | Payer: MEDICARE

## 2024-05-09 ENCOUNTER — Ambulatory Visit: Admit: 2024-05-09 | Discharge: 2024-05-10 | Payer: MEDICARE

## 2024-05-09 DIAGNOSIS — Z978 Presence of other specified devices: Secondary | ICD-10-CM

## 2024-05-09 DIAGNOSIS — Z79899 Other long term (current) drug therapy: Secondary | ICD-10-CM

## 2024-05-09 DIAGNOSIS — M961 Postlaminectomy syndrome, not elsewhere classified: Secondary | ICD-10-CM

## 2024-05-09 MED ORDER — OXYCODONE-ACETAMINOPHEN 10-325 MG PO TAB
1 | ORAL_TABLET | ORAL | 0 refills | 2.00000 days | Status: AC | PRN
Start: 2024-05-09 — End: ?

## 2024-05-09 NOTE — Progress Notes
 Todays visit took place via face-to-face encounter utilizing Zoom application. Total time 20 minutes.      [facility]       SUBJECTIVE:   Chronic back pain   Localized axial pain   Constant   Worse with activity   Ongoing for yrs   Recently increased   Feels the pain affects the lateral sides of the body now too   Hard to walk     Taking oxy, but not feeling managing with meds     Meds  Percocet 10mg  5 x day   Medrol dose pack in 01/2024    TRX   ITP - bupi - doesn't feel this helps much, tolerates well   Past had opioids in ITP   NEVRO scs          Review of Systems    Current Outpatient Medications:     acetaminophen (TYLENOL EXTRA STRENGTH) 500 mg tablet, Take two tablets by mouth every 8 hours as needed., Disp: , Rfl:     amitriptyline (ELAVIL) 25 mg tablet, Take one tablet by mouth at bedtime daily. Indications: neuropathic pain, Disp: 90 tablet, Rfl: 3    amLODIPine (NORVASC) 5 mg tablet, Take one tablet by mouth daily., Disp: , Rfl:     CHOLEcalciferoL (vitamin D3) (DIALYVITE VITAMIN D) 125 mcg (5,000 unit) capsule, Take 125 mcg by mouth daily., Disp: , Rfl:     cyanocobalamin (vitamin B-12) 1,000 mcg tablet, Take one tablet by mouth daily., Disp: , Rfl:     doxazosin (CARDURA) 2 mg tablet, Take two tablets by mouth at bedtime daily., Disp: , Rfl:     escitalopram oxalate (LEXAPRO) 20 mg tablet, , Disp: , Rfl:     fluticasone propionate (FLONASE) 50 mcg/actuation nasal spray, suspension, Apply fifty sprays into nose as directed as Needed., Disp: , Rfl:     gabapentin (NEURONTIN) 300 mg capsule, TAKE 1 CAPSULE BY MOUTH AT BEDTIME MAY INCREASE TO 2 AT BEDTIME AFTER 2 WEEKS, Disp: , Rfl:     hydroCHLOROthiazide (HYDRODIURIL) 25 mg tablet, Take one tablet by mouth every morning., Disp: , Rfl:     ipratropium bromide (ATROVENT) 42 mcg (0.06 %) nasal spray, Apply two sprays to each nostril as directed daily., Disp: , Rfl:     methylphenidate hcl (RITALIN) 20 mg tablet, Take one tablet by mouth daily., Disp: , Rfl:     methylPREDNIsolone (MEDROL (PAK)) 4 mg tablet, Take medication as directed on package for 6 days. Take with food., Disp: 21 tablet, Rfl: 0    metoprolol tartrate (LOPRESSOR) 100 mg tablet, Take one tablet by mouth twice daily., Disp: , Rfl:     multivit-min-FA-lycopen-lutein 300-600-300 mcg tab, Take 1 tablet by mouth daily., Disp: , Rfl:     oxyCODONE-acetaminophen (PERCOCET) 10-325 mg tablet, Take one tablet by mouth every 4 hours as needed for Pain for up to 30 days. Indications: pain, Disp: 150 tablet, Rfl: 0    [START ON 06/07/2024] oxyCODONE-acetaminophen (PERCOCET) 10-325 mg tablet, Take one tablet by mouth every 4 hours as needed for Pain for up to 30 days. Indications: pain Do not start before June 07, 2024., Disp: 150 tablet, Rfl: 0    [START ON 07/06/2024] oxyCODONE-acetaminophen (PERCOCET) 10-325 mg tablet, Take one tablet by mouth every 4 hours as needed for Pain for up to 30 days. Indications: pain Do not start before July 06, 2024., Disp: 150 tablet, Rfl: 0    QUEtiapine (SEROQUEL) 25 mg tablet, Take two  tablets by mouth daily., Disp: , Rfl:     senna/docusate (SENOKOT-S) 8.6/50 mg tablet, Take one tablet by mouth twice daily., Disp: , Rfl:     temazepam (RESTORIL) 15 mg capsule, Take one capsule by mouth at bedtime as needed., Disp: , Rfl:     testosterone cypionate (DEPO-TESTOSTERONE) 100 mg/mL injection, Inject 1 mL into the muscle every 14 days., Disp: , Rfl:     valsartan (DIOVAN) 160 mg tablet, Take one tablet by mouth daily., Disp: , Rfl:     warfarin sodium (WARFARIN PO), Take  by mouth., Disp: , Rfl:     XARELTO 20 mg tablet, Take one tablet by mouth daily., Disp: , Rfl:   Allergies   Allergen Reactions    Cymbalta [Duloxetine] MENTAL STATUS CHANGES     Physical Exam  Vitals:    05/09/24 1014   PainSc: Eight   Weight: 91.6 kg (202 lb)   Height: 180.3 cm (5' 11)     Oswestry Total Score:: (Patient-Rptd) 54  Pain Score: Eight  Body mass index is 28.17 kg/m?Kristopher Richard    General: Alert, oriented, mild distress  HEENT: normocephalic  Resp: Non labored breathing, no distress  MS: Sits during call .  Behavior: Calm, cooperative, behavior and speech normal.          IMPRESSION:  1. Long-term use of high-risk medication    2. Presence of intrathecal pump    3. Postlaminectomy syndrome, lumbar region          PLAN:   Encouraged strengthening, stretching and conservative therapies  Continue medication as prescribed   Refills provided x 3 mo   Discussed med side effects, risks  Suggest ITP increase in bupi  He will come in next week for this

## 2024-05-17 ENCOUNTER — Encounter: Admit: 2024-05-17 | Discharge: 2024-05-17 | Payer: MEDICARE

## 2024-05-17 ENCOUNTER — Ambulatory Visit: Admit: 2024-05-17 | Discharge: 2024-05-18 | Payer: MEDICARE

## 2024-05-19 ENCOUNTER — Encounter: Admit: 2024-05-19 | Discharge: 2024-05-19 | Payer: MEDICARE

## 2024-05-19 ENCOUNTER — Ambulatory Visit: Admit: 2024-05-19 | Discharge: 2024-05-19 | Payer: MEDICARE

## 2024-05-31 ENCOUNTER — Encounter: Admit: 2024-05-31 | Discharge: 2024-05-31 | Payer: MEDICARE

## 2024-05-31 ENCOUNTER — Ambulatory Visit: Admit: 2024-05-31 | Discharge: 2024-06-01 | Payer: MEDICARE

## 2024-05-31 DIAGNOSIS — M5442 Lumbago with sciatica, left side: Secondary | ICD-10-CM

## 2024-05-31 MED ORDER — METHYLPREDNISOLONE ACETATE 40 MG/ML IJ SUSP
40 mg | Freq: Once | INTRA_ARTICULAR | 0 refills | Status: CP | PRN
Start: 2024-05-31 — End: ?

## 2024-05-31 MED ORDER — BUPIVACAINE (PF) 0.5 % (5 MG/ML) IJ SOLN
5 mL | Freq: Once | INTRAMUSCULAR | 0 refills | Status: CP | PRN
Start: 2024-05-31 — End: ?

## 2024-05-31 NOTE — Procedures
 Attending Surgeon: Brayton Rank, MD    Anesthesia: Local    Pre-Procedure Diagnosis:   1. Myalgia    2. 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 Chronic bilateral low back pain with bilateral sciatica        Post-Procedure Diagnosis:   1. Myalgia    2. 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 Chronic bilateral low back pain with bilateral sciatica        Pain Score: Eight    Trigger Point  Locations: R quadratus lumborum    Consent:   Consent obtained: written  Consent given by: patient  Alternatives discussed: referral, delayed treatment and alternative treatment     Universal Protocol:  Relevant documents: relevant documents present and verified  Imaging studies: imaging studies available  Required items: required blood products, implants, devices, and special equipment available  Patient identity confirmed: Patient identify confirmed verbally with patient.        Time out: Immediately prior to procedure a time out was called to verify the correct patient, procedure, equipment, support staff and site/side marked as required      Medications (Left): 5 mL bupivacaine  PF 0.5 %; 40 mg methylPREDNISolone  ACETATE 40 mg/mL  Procedures Details:       Indications: Non-invasive conservative therapy is not successful as first line treatment    Conservative Therapies attempted -  Acupuncture, Chiropractic and Physical Therapy  Prep: 2% chlorhexidine  Needle size: 27 G  Number of muscles: 1 or 2    Approach: posterior    Patient tolerance: Patient tolerated the procedure well with no immediate complications. Pressure was applied, and hemostasis was accomplished.    Pre Procedure Pain: 8  Post Procedure Pain: 2        Estimated blood loss: none or minimal  Specimens: none  Patient tolerated the procedure well with no immediate complications. Pressure was applied, and hemostasis was accomplished.

## 2024-05-31 NOTE — Progress Notes
 [facility]       SUBJECTIVE:   Today for follow-up.  Said increase in his chronic pain.  It is in the bilateral lumbar lumbar spine.  Right greater than left.  He has trouble walking.  He does feel like there is some pain in the flank region as well.  It has been getting gradually worse over the last 6 months or so.  Denies any bowel or bladder dysfunction.         Review of Systems    Current Outpatient Medications:     acetaminophen  (TYLENOL  EXTRA STRENGTH) 500 mg tablet, Take two tablets by mouth every 8 hours as needed., Disp: , Rfl:     amitriptyline  (ELAVIL ) 25 mg tablet, Take one tablet by mouth at bedtime daily. Indications: neuropathic pain, Disp: 90 tablet, Rfl: 3    amLODIPine  (NORVASC ) 5 mg tablet, Take one tablet by mouth daily., Disp: , Rfl:     CHOLEcalciferoL (vitamin D3) (DIALYVITE VITAMIN D) 125 mcg (5,000 unit) capsule, Take 125 mcg by mouth daily., Disp: , Rfl:     cyanocobalamin (vitamin B-12) 1,000 mcg tablet, Take one tablet by mouth daily., Disp: , Rfl:     doxazosin (CARDURA) 2 mg tablet, Take two tablets by mouth at bedtime daily., Disp: , Rfl:     escitalopram oxalate (LEXAPRO) 20 mg tablet, , Disp: , Rfl:     fluticasone propionate (FLONASE) 50 mcg/actuation nasal spray, suspension, Apply fifty sprays into nose as directed as Needed., Disp: , Rfl:     gabapentin (NEURONTIN) 300 mg capsule, TAKE 1 CAPSULE BY MOUTH AT BEDTIME MAY INCREASE TO 2 AT BEDTIME AFTER 2 WEEKS, Disp: , Rfl:     hydroCHLOROthiazide (HYDRODIURIL) 25 mg tablet, Take one tablet by mouth every morning., Disp: , Rfl:     ipratropium bromide (ATROVENT) 42 mcg (0.06 %) nasal spray, Apply two sprays to each nostril as directed daily., Disp: , Rfl:     methylphenidate hcl (RITALIN) 20 mg tablet, Take one tablet by mouth daily., Disp: , Rfl:     methylPREDNIsolone  (MEDROL  (PAK)) 4 mg tablet, Take medication as directed on package for 6 days. Take with food., Disp: 21 tablet, Rfl: 0    metoprolol  tartrate (LOPRESSOR ) 100 mg tablet, Take one tablet by mouth twice daily., Disp: , Rfl:     multivit-min-FA-lycopen-lutein 300-600-300 mcg tab, Take 1 tablet by mouth daily., Disp: , Rfl:     oxyCODONE -acetaminophen  (PERCOCET) 10-325 mg tablet, Take one tablet by mouth every 4 hours as needed for Pain for up to 30 days. Indications: pain, Disp: 150 tablet, Rfl: 0    [START ON 06/07/2024] oxyCODONE -acetaminophen  (PERCOCET) 10-325 mg tablet, Take one tablet by mouth every 4 hours as needed for Pain for up to 30 days. Indications: pain Do not start before June 07, 2024., Disp: 150 tablet, Rfl: 0    [START ON 07/06/2024] oxyCODONE -acetaminophen  (PERCOCET) 10-325 mg tablet, Take one tablet by mouth every 4 hours as needed for Pain for up to 30 days. Indications: pain Do not start before July 06, 2024., Disp: 150 tablet, Rfl: 0    QUEtiapine (SEROQUEL) 25 mg tablet, Take two tablets by mouth daily., Disp: , Rfl:     senna/docusate (SENOKOT-S) 8.6/50 mg tablet, Take one tablet by mouth twice daily., Disp: , Rfl:     temazepam (RESTORIL) 15 mg capsule, Take one capsule by mouth at bedtime as needed., Disp: , Rfl:     testosterone cypionate (DEPO-TESTOSTERONE) 100 mg/mL injection,  Inject 1 mL into the muscle every 14 days., Disp: , Rfl:     valsartan (DIOVAN) 160 mg tablet, Take one tablet by mouth daily., Disp: , Rfl:     warfarin sodium (WARFARIN PO), Take  by mouth., Disp: , Rfl:     XARELTO 20 mg tablet, Take one tablet by mouth daily., Disp: , Rfl:   Allergies   Allergen Reactions    Cymbalta  [Duloxetine ] MENTAL STATUS CHANGES     Physical Exam  Vitals:    05/31/24 1424   BP: 138/77   BP Source: Arm, Left Upper   Pulse: 102   SpO2: 100%   PainSc: Eight   Weight: 93.9 kg (207 lb)   Height: 180.3 cm (5' 11)     Oswestry Total Score:: (Patient-Rptd) 46  Pain Score: Eight  Body mass index is 28.87 kg/m?SABRA  On physical exam he is some tenderness palpation lower lumbar spine.  Has some discrete tenderness to pinpoint palpation in the right lumbar paraspinal musculature    Diagnostic imaging:  Reviewed the CT scan he does have disc bulging at L4-5 with bilateral recess stenosis.  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  It is possible this is causing some of his issues.  From been scheduled him for bilateral L4-L5 transforaminal steroid injection as well as performing intramuscular trigger point ejections in the clinic

## 2024-06-01 DIAGNOSIS — M791 Myalgia, unspecified site: Secondary | ICD-10-CM

## 2024-06-01 DIAGNOSIS — M5417 Radiculopathy, lumbosacral region: Principal | ICD-10-CM

## 2024-06-11 ENCOUNTER — Encounter: Admit: 2024-06-11 | Discharge: 2024-06-11 | Payer: MEDICARE

## 2024-06-11 MED ORDER — AMITRIPTYLINE 25 MG PO TAB
25 mg | ORAL_TABLET | Freq: Every evening | ORAL | 0 refills | 30.00000 days | Status: AC
Start: 2024-06-11 — End: ?

## 2024-06-11 NOTE — Telephone Encounter
 Medication: Amitriptyline  25mg     LOV: 05/31/24   UOV: 08/15/24   Last Filled: 02/28/24   Labs: Comprehensive Metabolic Profile       No data to display              Refilled per provider protocol.

## 2024-06-20 ENCOUNTER — Encounter: Admit: 2024-06-20 | Discharge: 2024-06-20 | Payer: MEDICARE

## 2024-06-20 ENCOUNTER — Ambulatory Visit: Admit: 2024-06-20 | Discharge: 2024-06-20 | Payer: MEDICARE

## 2024-06-20 DIAGNOSIS — M5417 Radiculopathy, lumbosacral region: Principal | ICD-10-CM

## 2024-06-20 MED ORDER — IOHEXOL 300 MG IODINE/ML IV SOLN
1 mL | Freq: Once | 0 refills | Status: CP
Start: 2024-06-20 — End: ?

## 2024-06-20 MED ORDER — DEXAMETHASONE SODIUM PHOS (PF) 10 MG/ML IJ EPIDURAL SOLN
15 mg | Freq: Once | EPIDURAL | 0 refills | Status: CP
Start: 2024-06-20 — End: ?

## 2024-06-20 MED ORDER — SODIUM CHLORIDE 0.9 % IJ SOLN
3 mL | Freq: Once | INTRAMUSCULAR | 0 refills | Status: CP
Start: 2024-06-20 — End: ?

## 2024-06-20 NOTE — Discharge Instructions - Supplementary Instructions
 GENERAL POST PROCEDURE INSTRUCTIONS  Physician: _________________________________  Procedure Completed Today:  Joint Injection (hip, knee, shoulder)  Cervical Epidural Steroid Injection  Cervical Transforaminal Steroid Injection  Trigger Point Injection  Caudal Epidural Steroid Injection  Piriformis Injection  Pudendal Nerve Block  Other _____________________ Thoracic Epidural Steroid Injection  Lumbar Epidural Steroid Injection  Lumbar Transforaminal Steroid Injection  Facet Joint Injection  Celiac Nerve Block  Sacrococcygeal  Sacroiliac Joint Injection   Important information following your procedure today:  You may drive today     If you had sedation, you may NOT drive today  Rest at home for the next 6 hours.  You may then begin to resume your normal activities.  DO NOT drive any vehicle, operate any power tools, drink alcohol, make any major decisions, or sign any legal documents for the next 12 hours.  Pain relief may not be immediate. It is possible you may even experience an increase in pain during the first 24-48 hours followed by a gradual decrease of your pain.  Though the procedure is generally safe, and complications are rare, we do ask that you be aware of any of the following:  Any swelling, persistent redness, new bleeding or drainage from the site of the injection.  You should not experience a severe headache.  You should not run a fever over 101oF.  New onset of sharp, severe back and or neck pain.  New onset of upper or lower extremity numbness or weakness.  New difficulty controlling bowel or bladder function after injection.  New shortness of breath.  ** If any of these occur, please call to report this occurrence to the nurse of Dr. Janyth Contes at [phone]. If you are calling after 4:00 p.m. or on weekends or holidays, please call [phone] and ask to have the resident physician on call for the physician paged or go to your local emergency room.  You may experience soreness at the injection site. Ice can be applied at 20-minute intervals for the first 24 hours. The following day you may alternate ice with heat if you are experiencing muscle tightness, otherwise continue with ice. Ice works best at decreasing pain. Avoid application of direct heat, hot showers or hot tubs today.  Avoid strenuous activity today. You many resume your regular activities and exercise tomorrow.  Patients with diabetes may see an elevation in blood sugars for 7-10 days after the injection. It is important to pay close attention to your diet, check your blood sugars daily and report extreme elevations to the physician that manages your diabetes.  Patients taking daily blood thinners can resume their regular dose this evening.  It is important that you take all medications ordered by your pain physician. Taking medications as ordered is an important part of your pain care plan. If you cannot continue the medication plan, please notify the physician.    Possible side effects to steroids that may occur:  Flushing or redness of the face  Irritability  Fluid retention  Change in women's menses  Minor headache    If you are unable to keep your upcoming appointment, please notify the [facility] scheduler at [phone] at least 24 hours in advance. If you have questions for the surgery center, call [facility] at [phone].

## 2024-06-20 NOTE — Procedures
 Attending Surgeon: Brayton Rank, MD    Anesthesia: Local    Pre-Procedure Diagnosis:   1. 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             Transforaminal Lumbar/Sacral THERAPEUTIC  Procedure: transforaminal epidural    Laterality: bilateral   on 06/20/2024 11:45 AM  Location: lumbar - L4-5      Consent:   Consent obtained: verbal and written  Consent given by: patient  Risks discussed: allergic reaction, bleeding, bruising, infection, nerve damage, no change or worsening in pain, weakness and reaction to medication  Alternatives discussed: alternative treatment, delayed treatment and no treatment  Discussed with patient the purpose of the treatment/procedure, other ways of treating my condition, including no treatment/ procedure and the risks and benefits of the alternatives. Patient has decided to proceed with treatment/procedure.        Universal Protocol:  Relevant documents: relevant documents present and verified  Test results: test results available and properly labeled  Imaging studies: imaging studies available  Required items: required blood products, implants, devices, and special equipment available  Site marked: the operative site was marked  Patient identity confirmed: Patient identify confirmed verbally with patient.        Time out: Immediately prior to procedure a time out was called to verify the correct patient, procedure, equipment, support staff and site/side marked as required      Procedures Details:   Indications: pain   Prep: chlorhexidine  Number of Levels: 1  Approach: paramedian  Guidance: fluoroscopy  Contrast: Procedure confirmed with contrast under live fluoroscopy.  Needle size: 25 G  Injection procedure: Incremental injection, Negative aspiration for blood and Introduced needle  Amount Injected:   L4-5: 2mL  Patient tolerance: Patient tolerated the procedure well with no immediate complications. Pressure was applied, and hemostasis was accomplished.  Outcome: Pain improved  Comments: 7.5mg  of dexamethasone  and 2ml of normal saline injected at each level   This patient's clinical history, exam, AND imaging support radiculopathy AND there is a significant impact on quality of life and function AND the pain has been present for at least 4 weeks AND they have failed to improve with noninvasive conservative care.        Administrations This Visit       dexamethasone  PF (DECADRON ) epidural injection 15 mg       Admin Date  06/20/2024 Action  Given Dose  15 mg Route  Epidural Documented By  Nathaneil Search, RN              iohexoL  (OMNIPAQUE -300) 300 mg/mL injection 1 mL       Admin Date  06/20/2024 Action  Given Dose  1 mL Route  SEE ADMIN INSTRUCTIONS Documented By  Nathaneil Search, RN              sodium chloride  PF 0.9% injection 3 mL       Admin Date  06/20/2024 Action  Given Dose  3 mL Route  Injection Documented By  Nathaneil Search, RN                  Estimated blood loss: none or minimal  Specimens: none  Patient tolerated the procedure well with no immediate complications. Pressure was applied, and hemostasis was accomplished.

## 2024-06-20 NOTE — Progress Notes
 [facility]  INTERVENTIONAL PAIN PROCEDURE HISTORY AND PHYSICAL    Chief Complaint: Pain    HISTORY OF PRESENT ILLNESS:    This patient's clinical history, exam, AND imaging support radiculopathy AND there is a significant impact on quality of life and function AND the pain has been present for at least 4 weeks AND they have failed to improve with noninvasive conservative care.       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    Spinal headache       Surgical History:   Procedure Laterality Date    REVISION  SPINAL NEUROSTIMULATOR PULSE GENERATOR/ RECEIVER N/A 07/25/2017    Performed by Paulita Horning, MD at [facility] OR    IMPLANTATION INTRATHECAL PUMP IMPLANT N/A 10/17/2017    Performed by Paulita Horning, MD at [facility] OR    BACK SURGERY      HERNIA REPAIR      HX FUSION PROCEDURE      HX JOINT REPLACEMENT      total right, uni left    KNEE SURGERY      NECK SURGERY      PENIS SURGERY      PR LAPAROSCOPY SURG RPR INITIAL INGUINAL HERNIA      PROSTATECTOMY      STIMULATOR IMPLANT      SURGERY  unkown    right       family history includes Cancer in his mother.    Social History     Socioeconomic History    Marital status: Married   Tobacco Use    Smoking status: Never     Passive exposure: Never    Smokeless tobacco: Never   Vaping Use    Vaping status: Never Used   Substance and Sexual Activity    Alcohol use: Yes     Alcohol/week: 6.0 standard drinks of alcohol     Types: 6 Cans of beer per week    Drug use: Never    Sexual activity: Not Currently     Partners: Female       No Known Allergies    Vitals:    06/20/24 1201   BP: 135/81   BP Source: Arm, Left Upper   Pulse: 68   Temp: 37 ?C (98.6 ?F)   SpO2: 100%   O2 Device: None (Room air)   Weight: 92.1 kg (203 lb)   Height: 180.3 cm (5' 11)        Oswestry Total Score:: 58    REVIEW OF SYSTEMS: 10 point ROS obtained and negative except per HPI      PHYSICAL EXAM:  Gen: Alert x 3  Chest: CTAB  Neck:Supple  Psych: Normal mood and affect  Skin: no rashes or lesions  Neuro: Grossly intact  Musc:           IMPRESSION:    1. 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

## 2024-08-07 ENCOUNTER — Encounter: Admit: 2024-08-07 | Discharge: 2024-08-07 | Payer: MEDICARE

## 2024-08-15 ENCOUNTER — Ambulatory Visit: Admit: 2024-08-15 | Discharge: 2024-08-16 | Payer: MEDICARE

## 2024-08-15 ENCOUNTER — Encounter: Admit: 2024-08-15 | Discharge: 2024-08-15 | Payer: MEDICARE

## 2024-09-04 ENCOUNTER — Encounter: Admit: 2024-09-04 | Discharge: 2024-09-04 | Payer: MEDICARE

## 2024-11-14 ENCOUNTER — Encounter: Admit: 2024-11-14 | Discharge: 2024-11-14 | Payer: MEDICARE

## 2024-11-14 ENCOUNTER — Ambulatory Visit: Admit: 2024-11-14 | Discharge: 2024-11-15 | Payer: MEDICARE

## 2024-11-14 DIAGNOSIS — M961 Postlaminectomy syndrome, not elsewhere classified: Principal | ICD-10-CM

## 2024-11-14 DIAGNOSIS — M25512 Pain in left shoulder: Secondary | ICD-10-CM

## 2024-11-14 DIAGNOSIS — M47816 Spondylosis without myelopathy or radiculopathy, lumbar region: Secondary | ICD-10-CM

## 2024-11-14 DIAGNOSIS — Z79899 Other long term (current) drug therapy: Secondary | ICD-10-CM

## 2024-11-14 NOTE — Progress Notes [1]
 Todays visit took place via face-to-face encounter utilizing Zoom application. Total time 20 minutes.      [facility] CLINIC NOTE       SUBJECTIVE:   Kristopher Richard is a 78 y.o. 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     Chronic back pain   Feeling worse recently   Sharp pain down the leg and groin   Bil pain in legs and hurts up into the buttock muscle  Hard to walk more than 100 feet     Also shoulder pain in left  Joint pain   Hard to move and lift, reach   Past right shoulder surgery and is doing well on right   Was supposed to do the left in the past but did not complete because his surgeon left       Meds   Oxy     Trx   ITP pump - bupivacaine  (would like this increased)  SCS - Nevro   TFESI L4-5 on 07/2024- can't tell this was super helpful   TPI   Home exercises for neck and shoulder                       Review of Systems  Current Medications[1]  Allergies[2]  Physical Exam    General: Alert, oriented, mild distress  HEENT: normocephalic  Resp: Non labored breathing, no distress  MS: Sits during call .  Behavior: Calm, cooperative, behavior and speech normal.       There were no vitals filed for this visit.        There is no height or weight on file to calculate BMI.        CT L-SPINE WO CONTRAST 05/19/2024    Narrative  1. CT Thoracic spine without contrast  2. CT Lumbar spine without contrast    Reason for exam: Lower back and leg pain    Axial scans were obtained through the cervical, thoracic and lumbar spine without IV contrast administration.  Sagittal and coronal reformatted images were produced.    CT Thoracic spine :    Thoracic vertebrae appear intact and normal in alignment.  Degenerative changes of intervertebral discs are present with reduced disc space height and marginal osteophytic spurring.  Confluent anterior and lateral marginal osteophytic spurring fuses T2-T12 vertebral bodies.  There is intradiscal calcification/ossification at multiple levels.  There are degenerative changes of apophyseal and costovertebral joints with fusion at multiple levels.  Spinal canal and neural foramina are normal in diameter.  Neurostimulator electrode is present along left side of T2 spinous process with its tip entering the dorsal spinal canal between T1 and T2 spinous processes.  Intrathecal catheter extends from L1-L2 level 2 dorsal spinal canal at T10.  Neurostimulator lead enters the spinal canal posteriorly in the midline at L1-L2 and ascends in dorsal epidural space to the T8 level.    CT Lumbar spine:    Lumbar vertebrae appear intact and in normal alignment.  There are degenerative changes of intervertebral discs with bulky anterior lateral osteophytic spurring, mild broad posterior L1-L2, L2-L3 and L3-L4 disc bulging.  Degenerative changes of apophyseal joints are present with bony overgrowth.  Moderate circumferential L4-L5 disc bulging is present  with bilateral lateral recess stenosis and mild right neural foraminal stenosis.  L5-S1 disc is fused with intradiscal ossification and bulky anterolateral osteophytic spurring.  There is fusion of L5-S1 apophyseal joints.  Intrathecal catheter extends from L1-L2 level 2 dorsal spinal canal at T10.  Neurostimulator lead enters the spinal canal posteriorly in the midline at L1-L2 and ascends in dorsal epidural space to the T8 level.    Sacroiliac joints are fused.    Impression  CT Thoracic spine:  1. Degenerative changes of thoracic spine with confluent syndesmophytes fusing T2-T12 vertebral bodies.  2. Fusion of multiple apophyseal and costovertebral joints.  3. Normal diameter thoracic spinal canal and neural foramina.  4. Intrathecal catheter extending from lumbar region to T10.  5. Dorsal column stimulator electrode extending from lumbar region to T8.    CT Lumbar spine:  1. Degenerative changes of lumbar spine with bulky anterolateral osteophytic spurring at multiple levels.  2. Bilateral L4-L5 lateral recess stenosis and mild right neural foraminal stenosis.  3. Fusion L5-S1 disc and apophyseal joints.  4. Intrathecal spinal catheter extending to the thoracic region.  5. Dorsal column stimulator electrode extending from L1-L2 to the thoracic region.  6. Fusion of sacroiliac joints.      Finalized by Arlyss Ruth, M.D. on 05/22/2024 8:44 AM. Dictated by Arlyss Ruth, M.D. on 05/22/2024 8:27 AM.      CT T-Spine Results:    No results found for this or any previous visit from the past 365 days.    No results found for this or any previous visit from the past 365 days.    CT T-SPINE WO CONTRAST 05/19/2024    Narrative  1. CT Thoracic spine without contrast  2. CT Lumbar spine without contrast    Reason for exam: Lower back and leg pain    Axial scans were obtained through the cervical, thoracic and lumbar spine without IV contrast administration.  Sagittal and coronal reformatted images were produced.    CT Thoracic spine :    Thoracic vertebrae appear intact and normal in alignment.  Degenerative changes of intervertebral discs are present with reduced disc space height and marginal osteophytic spurring.  Confluent anterior and lateral marginal osteophytic spurring fuses T2-T12 vertebral bodies.  There is intradiscal calcification/ossification at multiple levels.  There are degenerative changes of apophyseal and costovertebral joints with fusion at multiple levels.  Spinal canal and neural foramina are normal in diameter.  Neurostimulator electrode is present along left side of T2 spinous process with its tip entering the dorsal spinal canal between T1 and T2 spinous processes.  Intrathecal catheter extends from L1-L2 level 2 dorsal spinal canal at T10.  Neurostimulator lead enters the spinal canal posteriorly in the midline at L1-L2 and ascends in dorsal epidural space to the T8 level.    CT Lumbar spine:    Lumbar vertebrae appear intact and in normal alignment.  There are degenerative changes of intervertebral discs with bulky anterior lateral osteophytic spurring, mild broad posterior L1-L2, L2-L3 and L3-L4 disc bulging.  Degenerative changes of apophyseal joints are present with bony overgrowth.  Moderate circumferential L4-L5 disc bulging is present with bilateral lateral recess stenosis and mild right neural foraminal stenosis.  L5-S1 disc is fused with intradiscal ossification and bulky anterolateral osteophytic spurring.  There is fusion of L5-S1 apophyseal joints.  Intrathecal catheter extends from L1-L2 level 2 dorsal spinal canal at T10.  Neurostimulator lead enters the spinal canal posteriorly in the midline at L1-L2 and ascends in dorsal  epidural space to the T8 level.    Sacroiliac joints are fused.    Impression  CT Thoracic spine:  1. Degenerative changes of thoracic spine with confluent syndesmophytes fusing T2-T12 vertebral bodies.  2. Fusion of multiple apophyseal and costovertebral joints.  3. Normal diameter thoracic spinal canal and neural foramina.  4. Intrathecal catheter extending from lumbar region to T10.  5. Dorsal column stimulator electrode extending from lumbar region to T8.    CT Lumbar spine:  1. Degenerative changes of lumbar spine with bulky anterolateral osteophytic spurring at multiple levels.  2. Bilateral L4-L5 lateral recess stenosis and mild right neural foraminal stenosis.  3. Fusion L5-S1 disc and apophyseal joints.  4. Intrathecal spinal catheter extending to the thoracic region.  5. Dorsal column stimulator electrode extending from L1-L2 to the thoracic region.  6. Fusion of sacroiliac joints.      IMPRESSION:  1. Postlaminectomy syndrome, lumbar region    2. Lumbar spondylosis    3. Long-term use of high-risk medication    4. Acute pain of left shoulder          PLAN:      Plan left shoulder x ray   Consider joint inj - plan to schedule in 3 weeks   Can increase ITP for lower ext pain relief           Encouraged strengthening, stretching and conservative therapies  Continue medication as prescribed   Refills provided x 3 mo   Discussed med side effects, risks  Last toxicology  - compliant  Oxycodone    Date Value Ref Range Status   02/08/2024 1675 ng/ml  Final     Comment:     pos     Urine Oxymorphone   Date Value Ref Range Status   02/08/2024 530 ng/ml  Final     Comment:     pos     Benzodiazepines   Date Value Ref Range Status   02/08/2024 neg  Final     Other Illicit Additives   Date Value Ref Range Status   02/08/2024 neg  Final      PDMP reviewed   no aberrant behavior.   12 week f.u           [1]   Current Outpatient Medications:     acetaminophen  (TYLENOL  EXTRA STRENGTH) 500 mg tablet, Take two tablets by mouth every 8 hours as needed., Disp: , Rfl:     amitriptyline  (ELAVIL ) 25 mg tablet, TAKE 1 TABLET BY MOUTH ONCE DAILY AT BEDTIME, Disp: 90 tablet, Rfl: 0    amLODIPine  (NORVASC ) 5 mg tablet, Take one tablet by mouth daily., Disp: , Rfl:     CHOLEcalciferoL (vitamin D3) (DIALYVITE VITAMIN D) 125 mcg (5,000 unit) capsule, Take 125 mcg by mouth daily., Disp: , Rfl:     cyanocobalamin (vitamin B-12) 1,000 mcg tablet, Take one tablet by mouth daily., Disp: , Rfl:     doxazosin (CARDURA) 2 mg tablet, Take two tablets by mouth at bedtime daily., Disp: , Rfl:     escitalopram oxalate (LEXAPRO) 20 mg tablet, , Disp: , Rfl:     fluticasone propionate (FLONASE) 50 mcg/actuation nasal spray, suspension, Apply fifty sprays into nose as directed as Needed., Disp: , Rfl:     gabapentin (NEURONTIN) 300 mg capsule, TAKE 1 CAPSULE BY MOUTH AT BEDTIME MAY INCREASE TO 2 AT BEDTIME AFTER 2 WEEKS, Disp: , Rfl:     hydroCHLOROthiazide (HYDRODIURIL) 25 mg tablet, Take one tablet by mouth  every morning., Disp: , Rfl:     ipratropium bromide (ATROVENT) 42 mcg (0.06 %) nasal spray, Apply two sprays to each nostril as directed daily., Disp: , Rfl:     methylphenidate hcl (RITALIN) 20 mg tablet, Take one tablet by mouth daily., Disp: , Rfl:     methylPREDNIsolone  (MEDROL  (PAK)) 4 mg tablet, Take medication as directed on package for 6 days. Take with food., Disp: 21 tablet, Rfl: 0    metoprolol  tartrate (LOPRESSOR ) 100 mg tablet, Take one tablet by mouth twice daily., Disp: , Rfl:     multivit-min-FA-lycopen-lutein 300-600-300 mcg tab, Take 1 tablet by mouth daily., Disp: , Rfl:     oxyCODONE -acetaminophen  (PERCOCET) 10-325 mg tablet, Take one tablet by mouth every 4 hours as needed for Pain. Indications: pain Do not start before September 06, 2024., Disp: 150 tablet, Rfl: 0    oxyCODONE -acetaminophen  (PERCOCET) 10-325 mg tablet, Take one tablet by mouth every 4 hours as needed for Pain. Indications: pain Do not start before October 05, 2024., Disp: 150 tablet, Rfl: 0    oxyCODONE -acetaminophen  (PERCOCET) 10-325 mg tablet, Take one tablet by mouth every 4 hours as needed for Pain for up to 30 days. Indications: pain Do not start before November 03, 2024., Disp: 150 tablet, Rfl: 0    senna/docusate (SENOKOT-S) 8.6/50 mg tablet, Take one tablet by mouth twice daily., Disp: , Rfl:     temazepam (RESTORIL) 15 mg capsule, Take one capsule by mouth at bedtime as needed., Disp: , Rfl:     testosterone cypionate (DEPO-TESTOSTERONE) 100 mg/mL injection, Inject 1 mL into the muscle every 14 days., Disp: , Rfl:     valsartan (DIOVAN) 160 mg tablet, Take one tablet by mouth daily., Disp: , Rfl:     XARELTO 20 mg tablet, Take one tablet by mouth daily., Disp: , Rfl:   [2] No Known Allergies

## 2024-12-03 ENCOUNTER — Encounter: Admit: 2024-12-03 | Discharge: 2024-12-03 | Payer: MEDICARE

## 2024-12-03 MED ORDER — AMITRIPTYLINE 25 MG PO TAB
25 mg | ORAL_TABLET | Freq: Every evening | ORAL | 0 refills | 30.00000 days | Status: AC
Start: 2024-12-03 — End: ?

## 2024-12-05 ENCOUNTER — Encounter: Admit: 2024-12-05 | Discharge: 2024-12-05 | Payer: MEDICARE

## 2024-12-14 ENCOUNTER — Encounter: Admit: 2024-12-14 | Discharge: 2024-12-14 | Payer: MEDICARE

## 2024-12-18 ENCOUNTER — Encounter: Admit: 2024-12-18 | Discharge: 2024-12-18 | Payer: MEDICARE

## 2024-12-19 ENCOUNTER — Encounter: Admit: 2024-12-19 | Discharge: 2024-12-19 | Payer: MEDICARE

## 2024-12-19 ENCOUNTER — Ambulatory Visit: Admit: 2024-12-19 | Discharge: 2024-12-20 | Payer: MEDICARE

## 2024-12-31 ENCOUNTER — Encounter: Admit: 2024-12-31 | Discharge: 2024-12-31 | Payer: MEDICARE

## 2024-12-31 DIAGNOSIS — M47816 Spondylosis without myelopathy or radiculopathy, lumbar region: Secondary | ICD-10-CM

## 2024-12-31 DIAGNOSIS — M961 Postlaminectomy syndrome, not elsewhere classified: Principal | ICD-10-CM

## 2024-12-31 DIAGNOSIS — M5417 Radiculopathy, lumbosacral region: Secondary | ICD-10-CM

## 2024-12-31 MED ORDER — OXYCODONE-ACETAMINOPHEN 10-325 MG PO TAB
1 | ORAL_TABLET | ORAL | 0 refills | 2.00000 days | Status: AC | PRN
Start: 2024-12-31 — End: ?

## 2024-12-31 NOTE — Telephone Encounter [36]
 Patient requesting refill of oxy  Last Office Visit 12/19/24  Next Office Visit 02/13/25  Required labs UDS on chart   [facility] last filled 12/06/24
